# Patient Record
Sex: Female | Born: 1950
Health system: Southern US, Community
[De-identification: ages and names within clinical notes are randomized; demographics above are authoritative.]

## PROBLEM LIST (undated history)

## (undated) DIAGNOSIS — M7121 Synovial cyst of popliteal space [Baker], right knee: Secondary | ICD-10-CM

## (undated) DIAGNOSIS — M8580 Other specified disorders of bone density and structure, unspecified site: Secondary | ICD-10-CM

## (undated) DIAGNOSIS — K5790 Diverticulosis of intestine, part unspecified, without perforation or abscess without bleeding: Secondary | ICD-10-CM

## (undated) DIAGNOSIS — I10 Essential (primary) hypertension: Secondary | ICD-10-CM

## (undated) DIAGNOSIS — E785 Hyperlipidemia, unspecified: Secondary | ICD-10-CM

## (undated) HISTORY — DX: Synovial cyst of popliteal space (Baker), right knee: M71.21

## (undated) HISTORY — DX: Essential (primary) hypertension: I10

## (undated) HISTORY — DX: Other specified disorders of bone density and structure, unspecified site: M85.80

## (undated) HISTORY — DX: Diverticulosis of intestine, part unspecified, without perforation or abscess without bleeding: K57.90

## (undated) HISTORY — DX: Hyperlipidemia, unspecified: E78.5

---

## 2010-11-16 HISTORY — PX: MENISCUS REPAIR: SHX5179

## 2013-11-16 DIAGNOSIS — M7121 Synovial cyst of popliteal space [Baker], right knee: Secondary | ICD-10-CM

## 2013-11-16 HISTORY — DX: Synovial cyst of popliteal space (Baker), right knee: M71.21

## 2014-01-12 LAB — HM COLONOSCOPY

## 2015-11-17 HISTORY — PX: CATARACT EXTRACTION: SUR2

## 2017-04-01 LAB — HM MAMMOGRAPHY

## 2017-04-16 LAB — HM DEXA SCAN

## 2017-11-16 DIAGNOSIS — R69 Illness, unspecified: Secondary | ICD-10-CM | POA: Diagnosis not present

## 2018-06-28 ENCOUNTER — Ambulatory Visit: Payer: Self-pay | Admitting: Nurse Practitioner

## 2018-07-27 ENCOUNTER — Ambulatory Visit (INDEPENDENT_AMBULATORY_CARE_PROVIDER_SITE_OTHER): Payer: Medicare HMO | Admitting: Nurse Practitioner

## 2018-07-27 ENCOUNTER — Other Ambulatory Visit: Payer: Self-pay

## 2018-07-27 ENCOUNTER — Encounter: Payer: Self-pay | Admitting: Nurse Practitioner

## 2018-07-27 VITALS — BP 126/65 | HR 52 | Temp 97.8°F | Ht 63.5 in | Wt 167.0 lb

## 2018-07-27 DIAGNOSIS — Z7689 Persons encountering health services in other specified circumstances: Secondary | ICD-10-CM

## 2018-07-27 DIAGNOSIS — E782 Mixed hyperlipidemia: Secondary | ICD-10-CM | POA: Diagnosis not present

## 2018-07-27 DIAGNOSIS — I10 Essential (primary) hypertension: Secondary | ICD-10-CM

## 2018-07-27 DIAGNOSIS — M7121 Synovial cyst of popliteal space [Baker], right knee: Secondary | ICD-10-CM

## 2018-07-27 MED ORDER — TRIAMTERENE-HCTZ 75-50 MG PO TABS: ORAL_TABLET | ORAL | 1 refills | Status: DC

## 2018-07-27 MED ORDER — SIMVASTATIN 10 MG PO TABS: 10.0000 mg | ORAL_TABLET | Freq: Every day | ORAL | 1 refills | Status: DC

## 2018-07-27 MED ORDER — LOSARTAN POTASSIUM 100 MG PO TABS: 100.0000 mg | ORAL_TABLET | Freq: Every day | ORAL | 1 refills | Status: DC

## 2018-07-27 MED ORDER — VERAPAMIL HCL 120 MG PO TABS: 120.0000 mg | ORAL_TABLET | Freq: Two times a day (BID) | ORAL | 1 refills | Status: DC

## 2018-07-27 NOTE — Assessment & Plan Note (Signed)
Controlled hypertension.  BP goal < 130/80.  Pt is working on lifestyle modifications.  Taking medications tolerating well without side effects. No currently known complications.  Plan: 1. Continue taking all medications without changes: - Losartan 100 mg once daily - Triamterene-HCTZ 75-50 mg once daily - verapamil 120 mg once daily 2. Obtain labs in next 2 weeks  3. Encouraged heart healthy diet and increasing exercise to 30 minutes most days of the week. 4. Check BP 1-2 x per week at home, keep log, and bring to clinic at next appointment. 5. Follow up 6 months.

## 2018-07-27 NOTE — Assessment & Plan Note (Signed)
Previously stable on labs.  No recent recheck.  Prior statin intolerance, but patient is taking simvastatin and tolerates well at current dose.    Plan: 1. Continue simvastatin 10 mg once daily 2. Continue work toward staying active and eating a generally healthy diet. 3. Labs in next 2 weeks - fasting 4. Followup after labs and in 6 months.

## 2018-07-27 NOTE — Patient Instructions (Addendum)
Pricilla Loveless,   Thank you for coming in to clinic today.  1. You will be due for FASTING BLOOD WORK.  This means you should eat no food or drink after midnight.  Drink only water or coffee without cream/sugar on the morning of your lab visit. - Please go ahead and have your labs drawn at [facility] for lab draw in about 2 weeks. - Your results will be available about 2-3 days after blood draw.  If you have set up a MyChart account, you can can log in to MyChart online to view your results and a brief explanation. Also, we can discuss your results together at your next office visit if you would like.  2. Continue medications without changes today.  3. Referral to Orthopedic Surgery for your baker's cyst.  Let me know if you prefer a different person in network.  Please schedule a follow-up appointment with [name], AGNP. Return in about 6 weeks (around 09/07/2018) for annual physical AND 6 months for Hypertension and Cholesterol.  If you have any other questions or concerns, please feel free to call the clinic or send a message through [place]. You may also schedule an earlier appointment if necessary.  You will receive a survey after today's visit either digitally by e-mail or paper by [employer]. Your experiences and feedback matter to [place].  Please respond so we know how we are doing as we provide care for you.   [name], DNP, AGNP-BC Adult Gerontology Nurse Practitioner [place]

## 2018-07-27 NOTE — Assessment & Plan Note (Signed)
Chronic presence of baker's cyst with subacute worsening/movement lower on popliteal space of right knee.  Now interferes with walking/ flexion of knee by causing a mild, nagging, aching pain.  Plan: 1. Patient desires orthopedic referral.  Referral placed. 2. Encouraged patient to take ibuprofen or Aleeve prn for discomfort and to help reduce swelling. 3. Considered imaging, but will defer as best option may be MRI. 4. Followup prn

## 2018-07-27 NOTE — Progress Notes (Signed)
Subjective:    Patient ID: Robin Lang, female    DOB: 1951-09-25, 67 y.o.   MRN: [id number]  Robin Lang is a 67 y.o. female presenting on 07/27/2018 for Establish Care (continuing medications ) and Cyst (baker cyst in the back of the Right knee x 4 yrs)  Patient is accompanied today by her sister, Jeronimo Greaves.  HPI Establish Care New Provider Pt last seen by PCP Lorrine Kin, MD at [facility], [place] about 6 months.  Obtain records.   - Obtains yearly DEXA scans for osteopenia.  Hypertension - Current medications: losartan, triamterene-hctz, verapamil, tolerating well without side effects - She is not currently symptomatic. - Pt denies headache, lightheadedness, dizziness, changes in vision, chest tightness/pressure, palpitations, leg swelling, sudden loss of speech or loss of consciousness. - She  reports an exercise routine that includes walking, 4-5 days per week. - Her diet is moderate in salt, moderate in fat, and moderate in carbohydrates.   Hyperlipidemia Patient is currently taking simvastatin 10 mg once daily and is tolerating it well without side effects.   Pt denies changes in vision, chest tightness/pressure, palpitations, shortness of breath, leg pain while walking, leg or arm weakness, and sudden loss of speech or loss of consciousness.   Baker's Cyst Has history of baker's cyst on Right knee.  Has had cortisone injections in past with frequent walking and Has lowered some to lower leg and bothers her with bending leg now and has aching, nagging pain.  Has had meniscus repairs in both knees on Left knee last in 2012.    Past Medical History:  Diagnosis Date  . Diverticulosis   . Hyperlipidemia   . Hypertension   . Osteopenia   . Osteoporosis    Past Surgical History:  Procedure Laterality Date  . CATARACT EXTRACTION Right 2017  . MENISCUS REPAIR Left 2012   Social History   Socioeconomic History  . Marital status: Divorced    Spouse  name: Not on file  . Number of children: 2  . Years of education: Not on file  . Highest education level: Some college, no degree  Occupational History  . Not on file  Social Needs  . Financial resource strain: Not on file  . Food insecurity:    Worry: Not on file    Inability: Not on file  . Transportation needs:    Medical: Not on file    Non-medical: Not on file  Tobacco Use  . Smoking status: Former Smoker    Packs/day: 0.50    Years: 9.00    Pack years: 4.50    Last attempt to quit: 1989    Years since quitting: 30.7  . Smokeless tobacco: Never Used  Substance and Sexual Activity  . Alcohol use: Yes    Alcohol/week: 2.0 standard drinks    Types: 2 Glasses of wine per week  . Drug use: Never  . Sexual activity: Not Currently  Lifestyle  . Physical activity:    Days per week: Not on file    Minutes per session: Not on file  . Stress: Not on file  Relationships  . Social connections:    Talks on phone: Not on file    Gets together: Not on file    Attends religious service: Not on file    Active member of club or organization: Not on file    Attends meetings of clubs or organizations: Not on file    Relationship status: Not on file  .  Intimate partner violence:    Fear of current or ex partner: Not on file    Emotionally abused: Not on file    Physically abused: Not on file    Forced sexual activity: Not on file  Other Topics Concern  . Not on file  Social History Narrative  . Not on file   Family History  Problem Relation Age of Onset  . Stroke Mother   . Renal cancer Mother   . Heart disease Father   . Heart attack Father   . Lung cancer Sister        metastatic  . Thyroid cancer Brother        metastatic  . Heart disease Sister   . Multiple sclerosis Brother    Current Outpatient Medications on File Prior to Visit  Medication Sig  . co-enzyme Q-10 30 MG capsule Take 30 mg by mouth 3 (three) times daily.  . Multiple Vitamins-Minerals (CENTRUM  WOMEN) TABS Take by mouth.   No current facility-administered medications on file prior to visit.    Review of Systems  Constitutional: Negative for chills and fever.  HENT: Negative for congestion and sore throat.   Eyes: Negative for pain.  Respiratory: Negative for cough, shortness of breath and wheezing.   Cardiovascular: Negative for chest pain, palpitations and leg swelling.  Gastrointestinal: Negative for abdominal pain, blood in stool, constipation, diarrhea, nausea and vomiting.  Endocrine: Negative for polydipsia.  Genitourinary: Negative for dysuria, frequency, hematuria and urgency.  Musculoskeletal: Positive for arthralgias (knees). Negative for back pain, myalgias and neck pain.  Skin: Negative.  Negative for rash.  Allergic/Immunologic: Negative for environmental allergies.  Neurological: Negative for dizziness, weakness and headaches.  Hematological: Does not bruise/bleed easily.  Psychiatric/Behavioral: Negative for dysphoric mood and suicidal ideas. The patient is not nervous/anxious.    Per HPI unless specifically indicated above    Objective:    BP 126/65 (BP Location: Right Arm, Patient Position: Sitting, Cuff Size: Normal)   Pulse (!) 52   Temp 97.8 F (36.6 C) (Oral)   Ht 5' 3.5" (1.613 m)   Wt 167 lb (75.8 kg)   BMI 29.12 kg/m   Wt Readings from Last 3 Encounters:  07/27/18 167 lb (75.8 kg)    Physical Exam  Constitutional: She is oriented to person, place, and time. She appears well-developed and well-nourished. No distress.  HENT:  Head: Normocephalic and atraumatic.  Neck: Normal range of motion. Neck supple. Carotid bruit is not present.  Cardiovascular: Normal rate, regular rhythm, S1 normal, S2 normal, normal heart sounds and intact distal pulses.  Pulmonary/Chest: Effort normal and breath sounds normal. No respiratory distress.  Abdominal: Soft. Bowel sounds are normal. She exhibits no distension. There is no hepatosplenomegaly. There is no  tenderness. No hernia.  Musculoskeletal: She exhibits no edema (pedal).  Neurological: She is alert and oriented to person, place, and time.  Skin: Skin is warm and dry. Capillary refill takes less than 2 seconds.  Psychiatric: She has a normal mood and affect. Her behavior is normal. Judgment and thought content normal.  Vitals reviewed.     Assessment & Plan:   Problem List Items Addressed This Visit      Cardiovascular and Mediastinum   Essential hypertension - Primary    Controlled hypertension.  BP goal < 130/80.  Pt is working on lifestyle modifications.  Taking medications tolerating well without side effects. No currently known complications.  Plan: 1. Continue taking all medications without changes: - Losartan  100 mg once daily - Triamterene-HCTZ 75-50 mg once daily - verapamil 120 mg once daily 2. Obtain labs in next 2 weeks  3. Encouraged heart healthy diet and increasing exercise to 30 minutes most days of the week. 4. Check BP 1-2 x per week at home, keep log, and bring to clinic at next appointment. 5. Follow up 6 months.        Relevant Medications   losartan (COZAAR) 100 MG tablet   simvastatin (ZOCOR) 10 MG tablet   triamterene-hydrochlorothiazide (MAXZIDE) 75-50 MG tablet   verapamil (CALAN) 120 MG tablet   Other Relevant Orders   Lipid panel   Comprehensive metabolic panel     Musculoskeletal and Integument   Synovial cyst of right popliteal space    Chronic presence of baker's cyst with subacute worsening/movement lower on popliteal space of right knee.  Now interferes with walking/ flexion of knee by causing a mild, nagging, aching pain.  Plan: 1. Patient desires orthopedic referral.  Referral placed. 2. Encouraged patient to take ibuprofen or Aleeve prn for discomfort and to help reduce swelling. 3. Considered imaging, but will defer as best option may be MRI. 4. Followup prn      Relevant Orders   Ambulatory referral to Orthopedic Surgery      Other   Mixed hyperlipidemia    Previously stable on labs.  No recent recheck.  Prior statin intolerance, but patient is taking simvastatin and tolerates well at current dose.    Plan: 1. Continue simvastatin 10 mg once daily 2. Continue work toward staying active and eating a generally healthy diet. 3. Labs in next 2 weeks - fasting 4. Followup after labs and in 6 months.      Relevant Medications   losartan (COZAAR) 100 MG tablet   simvastatin (ZOCOR) 10 MG tablet   triamterene-hydrochlorothiazide (MAXZIDE) 75-50 MG tablet   verapamil (CALAN) 120 MG tablet   Other Relevant Orders   Lipid panel   Comprehensive metabolic panel    Other Visit Diagnoses    Encounter to establish care        Previous PCP was in [place].  Records will be requested.  Past medical, family, and surgical history reviewed w/ patient in clinic today.     Meds ordered this encounter  Medications  . losartan (COZAAR) 100 MG tablet    Sig: Take 1 tablet (100 mg total) by mouth daily.    Dispense:  90 tablet    Refill:  1    Order Specific Question:   Supervising Provider    Answer:   Olin Hauser [[id number]]  . simvastatin (ZOCOR) 10 MG tablet    Sig: Take 1 tablet (10 mg total) by mouth daily.    Dispense:  90 tablet    Refill:  1    Order Specific Question:   Supervising Provider    Answer:   Olin Hauser [[id number]]  . triamterene-hydrochlorothiazide (MAXZIDE) 75-50 MG tablet    Sig: TAKE 1/2 (ONE HALF) TABLET BY MOUTH ONCE DAILY    Dispense:  45 tablet    Refill:  1    Order Specific Question:   Supervising Provider    Answer:   Olin Hauser [[id number]]  . verapamil (CALAN) 120 MG tablet    Sig: Take 1 tablet (120 mg total) by mouth 2 (two) times daily.    Dispense:  90 tablet    Refill:  1    Order Specific Question:   Supervising Provider  Answer:   Olin Hauser [[id number]]     Follow up plan: Return in about 6 weeks (around 09/07/2018) for annual  physical AND 6 months for Hypertension and Cholesterol.  [name], DNP, AGPCNP-BC Adult Gerontology Primary Care Nurse Practitioner [facility] 07/27/2018, 10:40 AM

## 2018-08-07 DIAGNOSIS — R69 Illness, unspecified: Secondary | ICD-10-CM | POA: Diagnosis not present

## 2018-08-08 ENCOUNTER — Encounter: Payer: Self-pay | Admitting: Nurse Practitioner

## 2018-08-09 DIAGNOSIS — M1711 Unilateral primary osteoarthritis, right knee: Secondary | ICD-10-CM | POA: Diagnosis not present

## 2018-08-16 DIAGNOSIS — E782 Mixed hyperlipidemia: Secondary | ICD-10-CM | POA: Diagnosis not present

## 2018-08-16 DIAGNOSIS — I10 Essential (primary) hypertension: Secondary | ICD-10-CM | POA: Diagnosis not present

## 2018-08-17 LAB — COMPREHENSIVE METABOLIC PANEL
ALT: 22 IU/L (ref 0–32)
AST: 21 IU/L (ref 0–40)
Albumin/Globulin Ratio: 1.9 (ref 1.2–2.2)
Albumin: 4.2 g/dL (ref 3.6–4.8)
Alkaline Phosphatase: 53 IU/L (ref 39–117)
BUN/Creatinine Ratio: 24 (ref 12–28)
BUN: 24 mg/dL (ref 8–27)
Bilirubin Total: 0.4 mg/dL (ref 0.0–1.2)
CO2: 22 mmol/L (ref 20–29)
Calcium: 10.1 mg/dL (ref 8.7–10.3)
Chloride: 104 mmol/L (ref 96–106)
Creatinine, Ser: 0.98 mg/dL (ref 0.57–1.00)
GFR calc Af Amer: 69 mL/min/{1.73_m2} (ref >59)
GFR calc non Af Amer: 59 mL/min/{1.73_m2} — ABNORMAL LOW (ref >59)
Globulin, Total: 2.2 g/dL (ref 1.5–4.5)
Glucose: 96 mg/dL (ref 65–99)
Potassium: 4 mmol/L (ref 3.5–5.2)
Sodium: 141 mmol/L (ref 134–144)
Total Protein: 6.4 g/dL (ref 6.0–8.5)

## 2018-08-17 LAB — LIPID PANEL
Chol/HDL Ratio: 2.8 ratio (ref 0.0–4.4)
Cholesterol, Total: 199 mg/dL (ref 100–199)
HDL: 72 mg/dL (ref >39)
LDL Calculated: 108 mg/dL — ABNORMAL HIGH (ref 0–99)
Triglycerides: 93 mg/dL (ref 0–149)
VLDL Cholesterol Cal: 19 mg/dL (ref 5–40)

## 2018-08-23 ENCOUNTER — Telehealth: Payer: Self-pay | Admitting: Nurse Practitioner

## 2018-08-23 ENCOUNTER — Telehealth: Payer: Self-pay

## 2018-08-23 DIAGNOSIS — Z1239 Encounter for other screening for malignant neoplasm of breast: Secondary | ICD-10-CM

## 2018-08-23 DIAGNOSIS — Z1211 Encounter for screening for malignant neoplasm of colon: Secondary | ICD-10-CM

## 2018-08-23 NOTE — Telephone Encounter (Signed)
Attempted to contact the pt, no answer. LMOM to return my call.  

## 2018-08-23 NOTE — Telephone Encounter (Signed)
Patient returned your call Sharyn Lull) Please call

## 2018-08-23 NOTE — Telephone Encounter (Signed)
Patient sent appointment requests asking how to schedule Colonoscopy and mammogram on 08/12/2018.  Message received by paper to inbox on 08/22/2018.  Patient can have these addressed at a medicare wellness visit, which is recommended at patient's next convenience.    Also placed orders today for: 1. colonoscopy referral - they will call to schedule.  2. Screening mammogram.  Patient needs to call and schedule.  Call the Scheduling phone number at [phone].  Keep appointment with me on 10/24 and we can followup at that time as well.  Kelita, Please call patient to let her know this is in process.

## 2018-08-23 NOTE — Telephone Encounter (Signed)
Returned patients call regarding colonoscopy referral.  She was unaware of the referral being sent.  She will look at the [facility] website to decide which physician she would like to have do her colonoscopy and call back at another time to schedule.  Thanks [employer]

## 2018-08-24 DIAGNOSIS — M1711 Unilateral primary osteoarthritis, right knee: Secondary | ICD-10-CM | POA: Diagnosis not present

## 2018-08-26 NOTE — Telephone Encounter (Signed)
Mychart message sent to the pt. I attempted to contact her, answer. LMOM to return my call.

## 2018-09-02 DIAGNOSIS — R69 Illness, unspecified: Secondary | ICD-10-CM | POA: Diagnosis not present

## 2018-09-05 ENCOUNTER — Encounter: Payer: Self-pay | Admitting: Nurse Practitioner

## 2018-09-07 DIAGNOSIS — R69 Illness, unspecified: Secondary | ICD-10-CM | POA: Diagnosis not present

## 2018-09-08 ENCOUNTER — Other Ambulatory Visit: Payer: Self-pay

## 2018-09-08 ENCOUNTER — Encounter: Payer: Self-pay | Admitting: Nurse Practitioner

## 2018-09-08 ENCOUNTER — Ambulatory Visit (INDEPENDENT_AMBULATORY_CARE_PROVIDER_SITE_OTHER): Payer: Medicare HMO | Admitting: Nurse Practitioner

## 2018-09-08 VITALS — BP 145/69 | HR 63 | Temp 97.6°F | Ht 63.5 in | Wt 172.0 lb

## 2018-09-08 DIAGNOSIS — Z114 Encounter for screening for human immunodeficiency virus [HIV]: Secondary | ICD-10-CM | POA: Diagnosis not present

## 2018-09-08 DIAGNOSIS — Z1159 Encounter for screening for other viral diseases: Secondary | ICD-10-CM

## 2018-09-08 DIAGNOSIS — R69 Illness, unspecified: Secondary | ICD-10-CM | POA: Diagnosis not present

## 2018-09-08 DIAGNOSIS — N183 Chronic kidney disease, stage 3 unspecified: Secondary | ICD-10-CM

## 2018-09-08 DIAGNOSIS — Z0000 Encounter for general adult medical examination without abnormal findings: Secondary | ICD-10-CM | POA: Diagnosis not present

## 2018-09-08 NOTE — Patient Instructions (Addendum)
Pricilla Loveless,   Thank you for coming in to clinic today.  1. Your provider would like to you have your annual eye exam. Please contact your current eye doctor or here are some good options for you to contact.   [facility]   Address: [address] [place], [place] [address] Phone: [phone]  Website: [email or web address] [address] [place], [place] [address] Phone: [phone] [email or web address]  [facility]  Address: [place], [place], [place] [address] Phone: [phone]   [facility] [address] [place], [place] [address] Phone: [phone]  [facility] Address: [place], [place], [place] [address]  Phone: [phone]  2. Shingrix - Shingles vaccine: 2 doses 2-6 months apart.  http://www.wolf.info/ for more information if you have questions. - You will need to get this at your local pharmacy.  3.  Tdap - tetanus plus Whooping cough booster - You will also need to get this at your local pharmacy  4. DEXA scan, colonoscopy: - Bring or copy your documentation and send to the clinic.  5. Your mammogram order has been placed.  Call the Scheduling phone number at [phone] to schedule your mammogram at your convenience.  You can choose to go to either location listed below.  Let the scheduler know which location you prefer.  [place]  [place], [place] [address]   [facility] [address], [place] [address]  ** Repeat Kidney function on meloxicam in about 3 months.  Take a break from meloxicam at least 1 day per week to allow kidney recovery.  You have Chronic Kidney Disease stage 3 - very early reduced kidney function.  Protect your kidneys by staying hydrated, keeping BP controlled, and avoiding medications that may damage your kidneys.  Call clinic the day you are going to have  your kidney testing done and we will fax orders to [facility].  Please schedule a follow-up appointment with [name], AGNP. Return in about 1 year (around 09/09/2019) for annual physical.  If you have any other questions or concerns, please feel free to call the clinic or send a message through [place]. You may also schedule an earlier appointment if necessary.  You will receive a survey after today's visit either digitally by e-mail or paper by [employer]. Your experiences and feedback matter to [place].  Please respond so we know how we are doing as we provide care for you.   [name], DNP, AGNP-BC Adult Gerontology Nurse Practitioner [place]

## 2018-09-08 NOTE — Progress Notes (Signed)
Subjective:    Patient ID: Robin Lang, female    DOB: 07/29/1951, 67 y.o.   MRN: [id number]  Robin Lang is a 67 y.o. female presenting on 09/08/2018 for Annual Exam   HPI Annual Physical Exam Patient has been feeling well.  They have no acute concerns today. Sleeps 5-7 hours per night uninterrupted usually.  Occasionally wakes to void.  HEALTH MAINTENANCE: Weight/BMI: increased slightly Physical activity: Regular - has just started [employer] with silver sneakers Diet: mostly vegetables plus protein shakes (with almond milk and banana), eggs Seatbelt: always Sunscreen: not regularly PAP: Last was normal last year Mammogram: due  DEXA: Last was about 1 year ago with osteopenia - repeat in 1 year Colon Cancer Screen: Had one 5 years ago - was due for repeat in 10 years HIV/HEP C: not currently sexually active.  Neg in 2001.   Optometry: regular visits Dentistry: regular - upcoming dental surgery  VACCINES: Tetanus: due Influenza: received 08/07/2018 Pneumonia: up to date Shingles: Due - has history of shingles  Past Medical History:  Diagnosis Date  . Baker's cyst of knee, right 2015  . Diverticulosis   . Hyperlipidemia   . Hypertension   . Osteopenia    Past Surgical History:  Procedure Laterality Date  . CATARACT EXTRACTION Right 2017  . MENISCUS REPAIR Left 2012   Social History   Socioeconomic History  . Marital status: Divorced    Spouse name: Not on file  . Number of children: 2  . Years of education: Not on file  . Highest education level: Some college, no degree  Occupational History  . Not on file  Social Needs  . Financial resource strain: Not on file  . Food insecurity:    Worry: Not on file    Inability: Not on file  . Transportation needs:    Medical: Not on file    Non-medical: Not on file  Tobacco Use  . Smoking status: Former Smoker    Packs/day: 0.50    Years: 9.00    Pack years: 4.50    Last attempt to quit: 1989    Years  since quitting: 30.8  . Smokeless tobacco: Never Used  Substance and Sexual Activity  . Alcohol use: Yes    Alcohol/week: 2.0 standard drinks    Types: 2 Glasses of wine per week  . Drug use: Never  . Sexual activity: Not Currently  Lifestyle  . Physical activity:    Days per week: Not on file    Minutes per session: Not on file  . Stress: Not on file  Relationships  . Social connections:    Talks on phone: Not on file    Gets together: Not on file    Attends religious service: Not on file    Active member of club or organization: Not on file    Attends meetings of clubs or organizations: Not on file    Relationship status: Not on file  . Intimate partner violence:    Fear of current or ex partner: Not on file    Emotionally abused: Not on file    Physically abused: Not on file    Forced sexual activity: Not on file  Other Topics Concern  . Not on file  Social History Narrative  . Not on file   Family History  Problem Relation Age of Onset  . Stroke Mother   . Renal cancer Mother   . Heart disease Father   . Heart attack Father   .  Lung cancer Sister        metastatic  . Thyroid cancer Brother        metastatic  . Heart disease Sister   . Multiple sclerosis Brother    Current Outpatient Medications on File Prior to Visit  Medication Sig  . co-enzyme Q-10 30 MG capsule Take 30 mg by mouth 3 (three) times daily.  Marland Kitchen losartan (COZAAR) 100 MG tablet Take 1 tablet (100 mg total) by mouth daily.  . meloxicam (MOBIC) 15 MG tablet Take 15 mg by mouth daily.  . Multiple Vitamins-Minerals (CENTRUM WOMEN) TABS Take by mouth.  . simvastatin (ZOCOR) 10 MG tablet Take 1 tablet (10 mg total) by mouth daily.  Marland Kitchen triamterene-hydrochlorothiazide (MAXZIDE) 75-50 MG tablet TAKE 1/2 (ONE HALF) TABLET BY MOUTH ONCE DAILY  . verapamil (CALAN) 120 MG tablet Take 1 tablet (120 mg total) by mouth 2 (two) times daily.   No current facility-administered medications on file prior to visit.      Review of Systems  Constitutional: Negative for chills and fever.  HENT: Negative for congestion and sore throat.   Eyes: Negative for pain.  Respiratory: Negative for cough, shortness of breath and wheezing.   Cardiovascular: Negative for chest pain, palpitations and leg swelling.  Gastrointestinal: Negative for abdominal pain, blood in stool, constipation, diarrhea, nausea and vomiting.  Endocrine: Negative for polydipsia.  Genitourinary: Negative for dysuria, frequency, hematuria and urgency.  Musculoskeletal: Positive for arthralgias. Negative for back pain, myalgias and neck pain.  Skin: Negative.  Negative for rash.  Allergic/Immunologic: Negative for environmental allergies.  Neurological: Negative for dizziness, weakness and headaches.  Hematological: Does not bruise/bleed easily.  Psychiatric/Behavioral: Negative for dysphoric mood and suicidal ideas. The patient is not nervous/anxious.    Per HPI unless specifically indicated above     Objective:    BP (!) 145/69   Pulse 63   Temp 97.6 F (36.4 C) (Oral)   Ht 5' 3.5" (1.613 m)   Wt 172 lb (78 kg)   BMI 29.99 kg/m   Wt Readings from Last 3 Encounters:  09/08/18 172 lb (78 kg)  07/27/18 167 lb (75.8 kg)    Physical Exam  Constitutional: She is oriented to person, place, and time. She appears well-developed and well-nourished. No distress.  HENT:  Head: Normocephalic and atraumatic.  Right Ear: External ear normal.  Left Ear: External ear normal.  Nose: Nose normal.  Mouth/Throat: Oropharynx is clear and moist.  Eyes: Pupils are equal, round, and reactive to light. Conjunctivae are normal.  Neck: Normal range of motion. Neck supple. No JVD present. No tracheal deviation present. No thyromegaly present.  Cardiovascular: Normal rate, regular rhythm, normal heart sounds and intact distal pulses. Exam reveals no gallop and no friction rub.  No murmur heard. Pulmonary/Chest: Effort normal and breath sounds normal.  No respiratory distress.  Abdominal: Soft. Bowel sounds are normal. She exhibits no distension. There is no hepatosplenomegaly. There is no tenderness.  Genitourinary: Rectal exam shows external hemorrhoid. Rectal exam shows no internal hemorrhoid, no fissure, no tenderness and anal tone normal.  Genitourinary Comments: Normal external female genitalia without lesions or fusion. Physiologic discharge on exam. Bimanual exam without adnexal masses, enlarged uterus, or cervical motion tenderness.  Breast - Normal exam w/ symmetric breasts, no mass, no nipple discharge, no skin changes or tenderness.    Musculoskeletal: Normal range of motion.  Lymphadenopathy:    She has no cervical adenopathy.  Neurological: She is alert and oriented to person, place,  and time. No cranial nerve deficit.  Skin: Skin is warm and dry.  Psychiatric: She has a normal mood and affect. Her behavior is normal. Judgment and thought content normal.  Nursing note and vitals reviewed.   Results for orders placed or performed in visit on 07/27/18  Lipid panel  Result Value Ref Range   Cholesterol, Total 199 100 - 199 mg/dL   Triglycerides 93 0 - 149 mg/dL   HDL 72 >39 mg/dL   VLDL Cholesterol Cal 19 5 - 40 mg/dL   LDL Calculated 108 (H) 0 - 99 mg/dL   Chol/HDL Ratio 2.8 0.0 - 4.4 ratio  Comprehensive metabolic panel  Result Value Ref Range   Glucose 96 65 - 99 mg/dL   BUN 24 8 - 27 mg/dL   Creatinine, Ser 0.98 0.57 - 1.00 mg/dL   GFR calc non Af Amer 59 (L) >59 mL/min/1.73   GFR calc Af Amer 69 >59 mL/min/1.73   BUN/Creatinine Ratio 24 12 - 28   Sodium 141 134 - 144 mmol/L   Potassium 4.0 3.5 - 5.2 mmol/L   Chloride 104 96 - 106 mmol/L   CO2 22 20 - 29 mmol/L   Calcium 10.1 8.7 - 10.3 mg/dL   Total Protein 6.4 6.0 - 8.5 g/dL   Albumin 4.2 3.6 - 4.8 g/dL   Globulin, Total 2.2 1.5 - 4.5 g/dL   Albumin/Globulin Ratio 1.9 1.2 - 2.2   Bilirubin Total 0.4 0.0 - 1.2 mg/dL   Alkaline Phosphatase 53 39 - 117 IU/L    AST 21 0 - 40 IU/L   ALT 22 0 - 32 IU/L      Assessment & Plan:   Problem List Items Addressed This Visit    None    Visit Diagnoses    Encounter for annual physical exam    -  Primary   Encounter for hepatitis C screening test for low risk patient       Relevant Orders   Hepatitis C antibody   Screening for HIV without presence of risk factors       Relevant Orders   HIV Antibody (routine testing w rflx)   CKD (chronic kidney disease) stage 3, GFR 30-59 ml/min (HCC)       Relevant Orders   Comprehensive metabolic panel      Physical exam with no new findings.  Well adult with no acute concerns.  Pre-visit labs reviewed and indicates patient has early stage III CKD. BP slightly elevated today.  Plan: 1. Obtain health maintenance screenings as above according to age. - Increase physical activity to 30 minutes most days of the week.  - Eat healthy diet high in vegetables and fruits; low in refined carbohydrates. - Due for HIV/HEPC screening in person with low risk behavior - Can defer DEXA to next year with prior osteopenia 1 year ago - mammogram this year - Bring documentation of prior DEXA, mammo, Coronary artery calcium scoring, colonoscopy. 2. For CKD: repeat CMP after meloxicam use in 3 months.  Can consider repeating sooner.  Reviewed need to take at least 1 day off per week for kidneys.  Use prn if possible.  Also reviewed other ways to protect and prevent further kidney damage. Return 1 year for annual physical.   Follow up plan: Return in about 1 year (around 09/09/2019) for annual physical.  [name], DNP, AGPCNP-BC Adult Gerontology Primary Care Nurse Practitioner [facility] 09/08/2018, 3:32 PM

## 2018-09-09 DIAGNOSIS — R69 Illness, unspecified: Secondary | ICD-10-CM | POA: Diagnosis not present

## 2018-09-12 ENCOUNTER — Ambulatory Visit
Admission: RE | Admit: 2018-09-12 | Discharge: 2018-09-12 | Disposition: A | Discharge status: Home / Self Care | Payer: Medicare HMO | Source: Ambulatory Visit | Attending: Nurse Practitioner | Admitting: Nurse Practitioner

## 2018-09-12 DIAGNOSIS — Z1231 Encounter for screening mammogram for malignant neoplasm of breast: Secondary | ICD-10-CM | POA: Insufficient documentation

## 2018-09-12 DIAGNOSIS — Z1239 Encounter for other screening for malignant neoplasm of breast: Secondary | ICD-10-CM

## 2018-09-13 DIAGNOSIS — M1711 Unilateral primary osteoarthritis, right knee: Secondary | ICD-10-CM | POA: Diagnosis not present

## 2018-09-14 DIAGNOSIS — R69 Illness, unspecified: Secondary | ICD-10-CM | POA: Diagnosis not present

## 2018-09-21 ENCOUNTER — Encounter: Payer: Self-pay | Admitting: Nurse Practitioner

## 2018-09-27 DIAGNOSIS — D2271 Melanocytic nevi of right lower limb, including hip: Secondary | ICD-10-CM | POA: Diagnosis not present

## 2018-09-27 DIAGNOSIS — L400 Psoriasis vulgaris: Secondary | ICD-10-CM | POA: Diagnosis not present

## 2018-09-27 DIAGNOSIS — D2261 Melanocytic nevi of right upper limb, including shoulder: Secondary | ICD-10-CM | POA: Diagnosis not present

## 2018-09-27 DIAGNOSIS — D225 Melanocytic nevi of trunk: Secondary | ICD-10-CM | POA: Diagnosis not present

## 2018-09-27 DIAGNOSIS — D485 Neoplasm of uncertain behavior of skin: Secondary | ICD-10-CM | POA: Diagnosis not present

## 2018-09-27 DIAGNOSIS — R69 Illness, unspecified: Secondary | ICD-10-CM | POA: Diagnosis not present

## 2018-09-27 DIAGNOSIS — D2272 Melanocytic nevi of left lower limb, including hip: Secondary | ICD-10-CM | POA: Diagnosis not present

## 2018-09-27 DIAGNOSIS — D2262 Melanocytic nevi of left upper limb, including shoulder: Secondary | ICD-10-CM | POA: Diagnosis not present

## 2018-09-27 DIAGNOSIS — D0462 Carcinoma in situ of skin of left upper limb, including shoulder: Secondary | ICD-10-CM | POA: Diagnosis not present

## 2018-10-07 DIAGNOSIS — C44629 Squamous cell carcinoma of skin of left upper limb, including shoulder: Secondary | ICD-10-CM | POA: Diagnosis not present

## 2018-10-27 DIAGNOSIS — M7121 Synovial cyst of popliteal space [Baker], right knee: Secondary | ICD-10-CM | POA: Diagnosis not present

## 2018-12-11 ENCOUNTER — Encounter: Payer: Self-pay | Admitting: Nurse Practitioner

## 2018-12-12 ENCOUNTER — Encounter: Payer: Self-pay | Admitting: Nurse Practitioner

## 2018-12-12 ENCOUNTER — Ambulatory Visit (INDEPENDENT_AMBULATORY_CARE_PROVIDER_SITE_OTHER): Payer: Medicare HMO | Admitting: Nurse Practitioner

## 2018-12-12 VITALS — BP 136/70 | HR 55 | Temp 98.0°F | Resp 16 | Ht 63.5 in | Wt 170.4 lb

## 2018-12-12 DIAGNOSIS — R21 Rash and other nonspecific skin eruption: Secondary | ICD-10-CM

## 2018-12-12 MED ORDER — CLOTRIMAZOLE-BETAMETHASONE 1-0.05 % EX CREA: 1.0000 "application " | TOPICAL_CREAM | Freq: Two times a day (BID) | CUTANEOUS | 0 refills | Status: DC

## 2018-12-12 MED ORDER — METHYLPREDNISOLONE ACETATE 40 MG/ML IJ SUSP
40.0000 mg | Freq: Once | INTRAMUSCULAR | Status: AC
  Administered 2018-12-12: 40 mg via INTRAMUSCULAR

## 2018-12-12 NOTE — Patient Instructions (Addendum)
Robin Lang,   Thank you for coming in to clinic today.  1. START lotrisone ointment twice daily for 14 days.  2. You have received a steroid injection today that will also help reduce your symptoms.  3. If not improving in next 2-3 days, call clinic for consideration of treatment change.  Please schedule a follow-up appointment with [name], AGNP. Return 2-5 days if symptoms worsen or fail to improve.  If you have any other questions or concerns, please feel free to call the clinic or send a message through [facility]. You may also schedule an earlier appointment if necessary.  You will receive a survey after today's visit either digitally by e-mail or paper by [employer]. Your experiences and feedback matter to [place].  Please respond so we know how we are doing as we provide care for you.  [name], DNP, AGNP-BC Adult Gerontology Nurse Practitioner [place]

## 2018-12-12 NOTE — Progress Notes (Signed)
Subjective:    Patient ID: Robin Lang, female    DOB: 05/22/1951, 67 y.o.   MRN: [id number]  Robin Lang is a 68 y.o. female presenting on 12/12/2018 for Rash (painful rash on inner thighs keeping her up at night. Also itching on hands. )   HPI Rash Patient presents with new rash between legs and on hands. STARTED Thurs last week.  Took new class that day with mat and steps.   Pain is described as burning.  Rash associated with itching.  Rash has continued to spread since starting. - Weights and balls that are not always wiped down. - Patient has intermittent sciatica, but this pain is very different. - Patient has not had a rash similar to this before. - Denies fever, chills, or sweats, drainage.  Social History   Tobacco Use  . Smoking status: Former Smoker    Packs/day: 0.50    Years: 9.00    Pack years: 4.50    Last attempt to quit: 1989    Years since quitting: 31.0  . Smokeless tobacco: Never Used  Substance Use Topics  . Alcohol use: Yes    Alcohol/week: 2.0 standard drinks    Types: 2 Glasses of wine per week  . Drug use: Never    Review of Systems Per HPI unless specifically indicated above     Objective:    BP 136/70   Pulse (!) 55   Temp 98 F (36.7 C) (Oral)   Resp 16   Ht 5' 3.5" (1.613 m)   Wt 170 lb 6.4 oz (77.3 kg)   SpO2 98%   BMI 29.71 kg/m   Wt Readings from Last 3 Encounters:  12/12/18 170 lb 6.4 oz (77.3 kg)  09/08/18 172 lb (78 kg)  07/27/18 167 lb (75.8 kg)    Physical Exam Vitals signs reviewed.  Constitutional:      General: She is not in acute distress.    Appearance: She is well-developed.  HENT:     Head: Normocephalic and atraumatic.  Cardiovascular:     Rate and Rhythm: Normal rate and regular rhythm.     Pulses:          Radial pulses are 2+ on the right side and 2+ on the left side.       Posterior tibial pulses are 1+ on the right side and 1+ on the left side.     Heart sounds: Normal heart sounds, S1 normal and  S2 normal.  Pulmonary:     Effort: Pulmonary effort is normal. No respiratory distress.     Breath sounds: Normal breath sounds and air entry.  Musculoskeletal:     Right lower leg: No edema.     Left lower leg: No edema.  Skin:    General: Skin is warm and dry.     Capillary Refill: Capillary refill takes less than 2 seconds.       Neurological:     Mental Status: She is alert and oriented to person, place, and time.  Psychiatric:        Attention and Perception: Attention normal.        Mood and Affect: Mood and affect normal.        Behavior: Behavior normal. Behavior is cooperative.    Results for orders placed or performed in visit on 09/15/18  HM MAMMOGRAPHY  Result Value Ref Range   HM Mammogram 0-4 Bi-Rad 0-4 Bi-Rad, Self Reported Normal  HM DEXA SCAN  Result Value Ref  Range   HM Dexa Scan osteopenia       Assessment & Plan:   Problem List Items Addressed This Visit    None    Visit Diagnoses    Rash in adult    -  Primary   Relevant Medications   clotrimazole-betamethasone (LOTRISONE) cream   methylPREDNISolone acetate (DEPO-MEDROL) injection 40 mg (Completed)    Rash appears to be inflammatory or fungal intertriginous rash.   May be complicated by exposure at gym and remaining in wet clothing. - No apparent bacterial infection at this time.  Rash currently uncomplicated.  Plan: 1. START lotrisone ointment bid x7-14 days 2. For itching/pain today, start with depomedrol injection.  No need for ongoing prednisone taper. 3. If rash continues spreading and worsening with treatment, cannot fully exclude cellulitis.  Instructed patient to call back to clinic. 4. FOLLOW-UP prn 5-7 days.  Meds ordered this encounter  Medications  . clotrimazole-betamethasone (LOTRISONE) cream    Sig: Apply 1 application topically 2 (two) times daily.    Dispense:  30 g    Refill:  0    Order Specific Question:   Supervising Provider    Answer:   Olin Hauser [[id number]]   . methylPREDNISolone acetate (DEPO-MEDROL) injection 40 mg    Follow up plan: Return 2-5 days if symptoms worsen or fail to improve.  [name], DNP, AGPCNP-BC Adult Gerontology Primary Care Nurse Practitioner [facility] 12/12/2018, 11:47 AM

## 2018-12-13 ENCOUNTER — Telehealth: Payer: Self-pay | Admitting: Nurse Practitioner

## 2018-12-13 DIAGNOSIS — L03818 Cellulitis of other sites: Secondary | ICD-10-CM

## 2018-12-13 MED ORDER — SULFAMETHOXAZOLE-TRIMETHOPRIM 800-160 MG PO TABS: 1.0000 | ORAL_TABLET | Freq: Two times a day (BID) | ORAL | 0 refills | Status: AC

## 2018-12-13 NOTE — Telephone Encounter (Signed)
STOP lotrisone  START Bactrim 800-160 mg one tablet twice daily for 10 days.  With continued spread, this is likely bacterial and needs the above antibiotic.

## 2018-12-13 NOTE — Telephone Encounter (Signed)
Advised 

## 2018-12-13 NOTE — Telephone Encounter (Signed)
Pt's rash is spreading.  Please call [phone]

## 2018-12-16 ENCOUNTER — Encounter: Payer: Self-pay | Admitting: Nurse Practitioner

## 2018-12-19 ENCOUNTER — Encounter: Payer: Self-pay | Admitting: Nurse Practitioner

## 2019-01-02 DIAGNOSIS — M25561 Pain in right knee: Secondary | ICD-10-CM | POA: Diagnosis not present

## 2019-01-02 DIAGNOSIS — M5136 Other intervertebral disc degeneration, lumbar region: Secondary | ICD-10-CM | POA: Diagnosis not present

## 2019-01-15 ENCOUNTER — Other Ambulatory Visit: Payer: Self-pay | Admitting: Nurse Practitioner

## 2019-01-15 DIAGNOSIS — I10 Essential (primary) hypertension: Secondary | ICD-10-CM

## 2019-02-15 ENCOUNTER — Other Ambulatory Visit: Payer: Self-pay | Admitting: Nurse Practitioner

## 2019-02-15 DIAGNOSIS — I10 Essential (primary) hypertension: Secondary | ICD-10-CM

## 2019-02-15 DIAGNOSIS — E782 Mixed hyperlipidemia: Secondary | ICD-10-CM

## 2019-03-08 ENCOUNTER — Other Ambulatory Visit: Payer: Self-pay | Admitting: Nurse Practitioner

## 2019-03-08 DIAGNOSIS — I10 Essential (primary) hypertension: Secondary | ICD-10-CM

## 2019-04-14 DIAGNOSIS — M25562 Pain in left knee: Secondary | ICD-10-CM | POA: Diagnosis not present

## 2019-04-14 DIAGNOSIS — M25561 Pain in right knee: Secondary | ICD-10-CM | POA: Diagnosis not present

## 2019-04-14 DIAGNOSIS — M5136 Other intervertebral disc degeneration, lumbar region: Secondary | ICD-10-CM | POA: Diagnosis not present

## 2019-04-23 ENCOUNTER — Other Ambulatory Visit: Payer: Self-pay | Admitting: Nurse Practitioner

## 2019-04-23 DIAGNOSIS — I10 Essential (primary) hypertension: Secondary | ICD-10-CM

## 2019-04-26 ENCOUNTER — Other Ambulatory Visit: Payer: Self-pay

## 2019-05-11 DIAGNOSIS — M170 Bilateral primary osteoarthritis of knee: Secondary | ICD-10-CM | POA: Diagnosis not present

## 2019-05-11 DIAGNOSIS — M67461 Ganglion, right knee: Secondary | ICD-10-CM | POA: Diagnosis not present

## 2019-06-06 ENCOUNTER — Other Ambulatory Visit: Payer: Self-pay | Admitting: Family Medicine

## 2019-06-06 DIAGNOSIS — I10 Essential (primary) hypertension: Secondary | ICD-10-CM

## 2019-06-21 ENCOUNTER — Ambulatory Visit (INDEPENDENT_AMBULATORY_CARE_PROVIDER_SITE_OTHER): Payer: Medicare HMO | Admitting: Nurse Practitioner

## 2019-06-21 ENCOUNTER — Ambulatory Visit
Admission: RE | Admit: 2019-06-21 | Discharge: 2019-06-21 | Disposition: A | Discharge status: Home / Self Care | Payer: Medicare HMO | Source: Ambulatory Visit | Attending: Nurse Practitioner | Admitting: Nurse Practitioner

## 2019-06-21 ENCOUNTER — Encounter: Payer: Self-pay | Admitting: Nurse Practitioner

## 2019-06-21 ENCOUNTER — Other Ambulatory Visit: Payer: Self-pay | Admitting: Nurse Practitioner

## 2019-06-21 ENCOUNTER — Other Ambulatory Visit: Payer: Self-pay

## 2019-06-21 VITALS — BP 136/80 | HR 76 | Ht 63.5 in | Wt 156.8 lb

## 2019-06-21 DIAGNOSIS — R2242 Localized swelling, mass and lump, left lower limb: Secondary | ICD-10-CM

## 2019-06-21 DIAGNOSIS — M7989 Other specified soft tissue disorders: Secondary | ICD-10-CM | POA: Diagnosis not present

## 2019-06-21 NOTE — Progress Notes (Signed)
Subjective:    Patient ID: Robin Lang, female    DOB: December 04, 1950, 68 y.o.   MRN: [id number]  Ericka Marcellus is a 68 y.o. female presenting on 06/21/2019 for Mass (painful 2cm lump on the left foot x 3 days. )   HPI Painful lump top of LEFT foot Patient noted new 2 cm round lump on top of foot for last 3 days.  This has occurred after progressively worsening left foot pain over last 3 weeks.  Patient is very active individual.  She walks every day, participates in exercise class regularly 4-5 days per week.  Now has pain with pressure, numb pain, constant.  Exercise class yesterday with lots of footwork / movement so is more sore today.  Worsens daily.  Lump is hard and has not become red/irritated, draining.  Social History   Tobacco Use  . Smoking status: Former Smoker    Packs/day: 0.50    Years: 9.00    Pack years: 4.50    Quit date: 1989    Years since quitting: 31.6  . Smokeless tobacco: Never Used  Substance Use Topics  . Alcohol use: Yes    Alcohol/week: 2.0 standard drinks    Types: 2 Glasses of wine per week  . Drug use: Never    Review of Systems Per HPI unless specifically indicated above     Objective:    BP 136/80 (BP Location: Left Arm, Patient Position: Sitting, Cuff Size: Normal)   Pulse 76   Ht 5' 3.5" (1.613 m)   Wt 156 lb 12.8 oz (71.1 kg)   BMI 27.34 kg/m   Wt Readings from Last 3 Encounters:  06/21/19 156 lb 12.8 oz (71.1 kg)  12/12/18 170 lb 6.4 oz (77.3 kg)  09/08/18 172 lb (78 kg)    Physical Exam Vitals signs reviewed.  Constitutional:      General: She is not in acute distress.    Appearance: She is well-developed.  HENT:     Head: Normocephalic and atraumatic.  Cardiovascular:     Rate and Rhythm: Normal rate and regular rhythm.     Pulses:          Radial pulses are 2+ on the right side and 2+ on the left side.       Posterior tibial pulses are 1+ on the right side and 1+ on the left side.     Heart sounds: Normal heart sounds,  S1 normal and S2 normal.  Pulmonary:     Effort: Pulmonary effort is normal. No respiratory distress.     Breath sounds: Normal breath sounds and air entry.  Musculoskeletal:     Right lower leg: No edema.     Left lower leg: No edema.     Left foot: Normal range of motion and normal capillary refill. Tenderness and deformity present. No bony tenderness, swelling, crepitus or laceration.       Feet:  Skin:    General: Skin is warm and dry.     Capillary Refill: Capillary refill takes less than 2 seconds.  Neurological:     General: No focal deficit present.     Mental Status: She is alert and oriented to person, place, and time.  Psychiatric:        Attention and Perception: Attention normal.        Mood and Affect: Mood and affect normal.        Behavior: Behavior normal. Behavior is cooperative.  Thought Content: Thought content normal.        Judgment: Judgment normal.      Results for orders placed or performed in visit on 09/15/18  HM MAMMOGRAPHY  Result Value Ref Range   HM Mammogram 0-4 Bi-Rad 0-4 Bi-Rad, Self Reported Normal  HM DEXA SCAN  Result Value Ref Range   HM Dexa Scan osteopenia       Assessment & Plan:   Problem List Items Addressed This Visit    None    Visit Diagnoses    Foot mass, left    -  Primary    Uncertain etiology, but likely inflammatory.  May have overuse component or may be result of prior stress fracture given history of pain starting 3 weeks ago. Inadequate conservative therapy to date.  Plan: 1. Foot xray today (LEFT) foot  2. START Aleve 440 mg bid x 14 days then prn.  Use ice after every exercise class/walking. 3. May benefit from podiatry vs sports medicine in future for evaluation/treatment. 4. Follow-up 2-4 weeks prn no improvement on conservative therapy.   Follow up plan: Return 2-4 weeks if symptoms worsen or fail to improve.  [name], DNP, AGPCNP-BC Adult Gerontology Primary Care Nurse Practitioner [facility] 06/21/2019, 11:17 AM

## 2019-06-21 NOTE — Patient Instructions (Addendum)
Ames Dura,   Thank you for coming in to clinic today.  1. Take Aleve 440 mg (two tablets) over the counter twice daily for 14 days.  2. Use ice over your foot after exercise and walking.  Please schedule a follow-up appointment with [name], AGNP. Return 2-4 weeks if symptoms worsen or fail to improve.  If you have any other questions or concerns, please feel free to call the clinic or send a message through [facility]. You may also schedule an earlier appointment if necessary.  You will receive a survey after today's visit either digitally by e-mail or paper by [employer]. Your experiences and feedback matter to [place].  Please respond so we know how we are doing as we provide care for you.   [name], DNP, AGNP-BC Adult Gerontology Nurse Practitioner [place]

## 2019-06-22 ENCOUNTER — Telehealth: Payer: Self-pay | Admitting: Nurse Practitioner

## 2019-06-22 NOTE — Telephone Encounter (Signed)
Returned call about her xrays.. Asked to speak the Ms. Merrilyn Puma.  CB#  [phone]  Thanks Con Memos

## 2019-06-22 NOTE — Telephone Encounter (Signed)
Returned patient's call.  Answered her questions.  Clinically, suspect avulsion fracture is most likely cause.  Podiatry referral has already been placed.  Patient has no additional questions.

## 2019-06-23 ENCOUNTER — Telehealth: Payer: Self-pay | Admitting: Nurse Practitioner

## 2019-06-23 NOTE — Telephone Encounter (Signed)
She is more than welcome to see orthopedics if she can get in sooner.

## 2019-06-23 NOTE — Telephone Encounter (Signed)
Pt called saying she has an appt with the podiatrist August 21st.  She wants to know if she can go to her ortho doctor if she can get in sooner.  She does not want to walk around not knowing if her ankle is broken or not  Please advise  Con Memos

## 2019-06-23 NOTE — Telephone Encounter (Signed)
The pt was notified. She verbalize understanding, no questions or concerns. 

## 2019-07-03 DIAGNOSIS — M19072 Primary osteoarthritis, left ankle and foot: Secondary | ICD-10-CM | POA: Diagnosis not present

## 2019-07-13 ENCOUNTER — Other Ambulatory Visit: Payer: Self-pay | Admitting: Family Medicine

## 2019-07-13 DIAGNOSIS — I10 Essential (primary) hypertension: Secondary | ICD-10-CM

## 2019-07-28 ENCOUNTER — Encounter: Payer: Self-pay | Admitting: Nurse Practitioner

## 2019-07-31 ENCOUNTER — Other Ambulatory Visit: Payer: Self-pay | Admitting: Nurse Practitioner

## 2019-07-31 DIAGNOSIS — Z1231 Encounter for screening mammogram for malignant neoplasm of breast: Secondary | ICD-10-CM

## 2019-08-02 DIAGNOSIS — M25561 Pain in right knee: Secondary | ICD-10-CM | POA: Diagnosis not present

## 2019-08-13 DIAGNOSIS — R69 Illness, unspecified: Secondary | ICD-10-CM | POA: Diagnosis not present

## 2019-08-23 ENCOUNTER — Ambulatory Visit (INDEPENDENT_AMBULATORY_CARE_PROVIDER_SITE_OTHER): Payer: Medicare HMO | Admitting: Nurse Practitioner

## 2019-08-23 ENCOUNTER — Encounter: Payer: Self-pay | Admitting: Nurse Practitioner

## 2019-08-23 ENCOUNTER — Other Ambulatory Visit: Payer: Self-pay

## 2019-08-23 VITALS — BP 131/59 | HR 66 | Ht 63.5 in | Wt 159.4 lb

## 2019-08-23 DIAGNOSIS — M85859 Other specified disorders of bone density and structure, unspecified thigh: Secondary | ICD-10-CM | POA: Diagnosis not present

## 2019-08-23 DIAGNOSIS — I10 Essential (primary) hypertension: Secondary | ICD-10-CM | POA: Diagnosis not present

## 2019-08-23 DIAGNOSIS — Z23 Encounter for immunization: Secondary | ICD-10-CM | POA: Diagnosis not present

## 2019-08-23 DIAGNOSIS — E782 Mixed hyperlipidemia: Secondary | ICD-10-CM | POA: Diagnosis not present

## 2019-08-23 DIAGNOSIS — S41111A Laceration without foreign body of right upper arm, initial encounter: Secondary | ICD-10-CM

## 2019-08-23 DIAGNOSIS — Z1159 Encounter for screening for other viral diseases: Secondary | ICD-10-CM | POA: Diagnosis not present

## 2019-08-23 DIAGNOSIS — Z0000 Encounter for general adult medical examination without abnormal findings: Secondary | ICD-10-CM | POA: Diagnosis not present

## 2019-08-23 DIAGNOSIS — Z13220 Encounter for screening for lipoid disorders: Secondary | ICD-10-CM | POA: Diagnosis not present

## 2019-08-23 NOTE — Progress Notes (Signed)
Subjective:    Patient ID: Robin Lang, female    DOB: May 28, 1951, 68 y.o.   MRN: [id number]  Robin Lang is a 68 y.o. female presenting on 08/23/2019 for Annual Exam  HPI Annual Physical Exam Patient has been feeling well.  They have acute concerns today low back pain and radiation down left leg.  Patient is using lidocaine patches 5% is improved significantly (12 hr on, 12 hr off).  Pain is now 24/7 for hip/back pain.  Occasionally cannot walk her dog. Sleeps 6-8 hours per night interrupted. Reads and goes back to sleep.  HEALTH MAINTENANCE: Weight/BMI: generally improved with physical activity Physical activity: cardio 3 x per week, yoga 2x per week with silver sneakers Diet: generally healthy Seatbelt: always Sunscreen: regularly Mammogram: scheduled for 11/4 DEXA: due - osteopenia with last DEXA 04/2017 Colon Cancer Screen: Patient reports no known date - Likely due 04/2020 HEP C: agrees today Optometry: every 2 years or more frequently Dentistry: every 6 mos  VACCINES: Tetanus: due Influenza: desires today   Patient has two places on skin - lacerations without swelling LEFT forearm.  Needs tdap.   Past Medical History:  Diagnosis Date  . Baker's cyst of knee, right 2015  . Diverticulosis   . Hyperlipidemia   . Hypertension   . Osteopenia    Past Surgical History:  Procedure Laterality Date  . CATARACT EXTRACTION Right 2017  . MENISCUS REPAIR Left 2012   Social History   Socioeconomic History  . Marital status: Divorced    Spouse name: Not on file  . Number of children: 2  . Years of education: Not on file  . Highest education level: Some college, no degree  Occupational History  . Not on file  Social Needs  . Financial resource strain: Not on file  . Food insecurity    Worry: Not on file    Inability: Not on file  . Transportation needs    Medical: Not on file    Non-medical: Not on file  Tobacco Use  . Smoking status: Former Smoker   Packs/day: 0.50    Years: 9.00    Pack years: 4.50    Quit date: 1989    Years since quitting: 31.7  . Smokeless tobacco: Never Used  Substance and Sexual Activity  . Alcohol use: Yes    Alcohol/week: 2.0 standard drinks    Types: 2 Glasses of wine per week  . Drug use: Never  . Sexual activity: Not Currently  Lifestyle  . Physical activity    Days per week: Not on file    Minutes per session: Not on file  . Stress: Not on file  Relationships  . Social Herbalist on phone: Not on file    Gets together: Not on file    Attends religious service: Not on file    Active member of club or organization: Not on file    Attends meetings of clubs or organizations: Not on file    Relationship status: Not on file  . Intimate partner violence    Fear of current or ex partner: Not on file    Emotionally abused: Not on file    Physically abused: Not on file    Forced sexual activity: Not on file  Other Topics Concern  . Not on file  Social History Narrative  . Not on file   Family History  Problem Relation Age of Onset  . Stroke Mother   . Renal cancer  Mother   . Heart disease Father   . Heart attack Father   . Lung cancer Sister        metastatic  . Thyroid cancer Brother        metastatic  . Heart disease Sister   . Multiple sclerosis Brother   . Breast cancer Neg Hx    Current Outpatient Medications on File Prior to Visit  Medication Sig  . co-enzyme Q-10 30 MG capsule Take 30 mg by mouth daily.   Marland Kitchen lidocaine (LIDODERM) 5 % lidocaine 5 % topical patch  APPLY 1 PATCH TOPICALLY ONCE DAILY (MAY WEAR FOR 12 HOURS AND REMOVE FOR 12 HOURS)  . losartan (COZAAR) 100 MG tablet TAKE 1 TABLET BY MOUTH ONCE DAILY -  NEED  APPOINTMENT  . meloxicam (MOBIC) 15 MG tablet TAKE 1 TABLET BY MOUTH ONCE DAILY AFTER A MEAL  . Multiple Vitamins-Minerals (CENTRUM WOMEN) TABS Take by mouth.  . simvastatin (ZOCOR) 10 MG tablet Take 1 tablet by mouth once daily  .  triamterene-hydrochlorothiazide (MAXZIDE) 75-50 MG tablet Take 1/2 (one-half) tablet by mouth once daily  . verapamil (CALAN) 120 MG tablet Take 1 tablet by mouth twice daily   No current facility-administered medications on file prior to visit.     Review of Systems  Constitutional: Negative for chills and fever.  HENT: Negative for congestion and sore throat.   Eyes: Negative for pain.  Respiratory: Negative for cough, shortness of breath and wheezing.   Cardiovascular: Negative for chest pain, palpitations and leg swelling.  Gastrointestinal: Negative for abdominal pain, blood in stool, constipation, diarrhea, nausea and vomiting.  Endocrine: Negative for polydipsia.  Genitourinary: Negative for dysuria, frequency, hematuria and urgency.  Musculoskeletal: Positive for arthralgias. Negative for back pain, myalgias and neck pain.  Skin: Negative.  Negative for rash.  Allergic/Immunologic: Negative for environmental allergies.  Neurological: Negative for dizziness, weakness and headaches.  Hematological: Does not bruise/bleed easily.  Psychiatric/Behavioral: Negative for dysphoric mood and suicidal ideas. The patient is not nervous/anxious.    Per HPI unless specifically indicated above     Objective:    BP (!) 131/59 (BP Location: Right Arm, Patient Position: Sitting, Cuff Size: Normal)   Pulse 66   Ht 5' 3.5" (1.613 m)   Wt 159 lb 6.4 oz (72.3 kg)   BMI 27.79 kg/m   Wt Readings from Last 3 Encounters:  08/23/19 159 lb 6.4 oz (72.3 kg)  06/21/19 156 lb 12.8 oz (71.1 kg)  12/12/18 170 lb 6.4 oz (77.3 kg)    Physical Exam Vitals signs and nursing note reviewed.  Constitutional:      General: She is not in acute distress.    Appearance: Normal appearance. She is well-developed.  HENT:     Head: Normocephalic and atraumatic.     Right Ear: Tympanic membrane, ear canal and external ear normal.     Left Ear: Tympanic membrane, ear canal and external ear normal.     Nose:  Nose normal.     Mouth/Throat:     Mouth: Mucous membranes are moist.     Pharynx: Oropharynx is clear.  Eyes:     Conjunctiva/sclera: Conjunctivae normal.     Pupils: Pupils are equal, round, and reactive to light.  Neck:     Musculoskeletal: Normal range of motion and neck supple.     Thyroid: No thyromegaly.     Vascular: No JVD.     Trachea: No tracheal deviation.  Cardiovascular:     Rate  and Rhythm: Normal rate and regular rhythm.     Heart sounds: Normal heart sounds. No murmur. No friction rub. No gallop.   Pulmonary:     Effort: Pulmonary effort is normal. No respiratory distress.     Breath sounds: Normal breath sounds.  Abdominal:     General: Abdomen is flat. Bowel sounds are normal. There is no distension.     Palpations: Abdomen is soft.     Tenderness: There is no abdominal tenderness.  Musculoskeletal: Normal range of motion.  Lymphadenopathy:     Cervical: No cervical adenopathy.  Skin:    General: Skin is warm and dry.     Capillary Refill: Capillary refill takes less than 2 seconds.     Comments: Right forearm laceration x 2 < 1 inch, scabbed over, no periwound changes  Neurological:     General: No focal deficit present.     Mental Status: She is alert and oriented to person, place, and time. Mental status is at baseline.     Cranial Nerves: No cranial nerve deficit.  Psychiatric:        Mood and Affect: Mood normal.        Behavior: Behavior normal.        Thought Content: Thought content normal.        Judgment: Judgment normal.    Results for orders placed or performed in visit on 09/15/18  HM MAMMOGRAPHY  Result Value Ref Range   HM Mammogram 0-4 Bi-Rad 0-4 Bi-Rad, Self Reported Normal  HM DEXA SCAN  Result Value Ref Range   HM Dexa Scan osteopenia       Assessment & Plan:   Problem List Items Addressed This Visit      Cardiovascular and Mediastinum   Essential hypertension   Relevant Orders   Comprehensive metabolic panel (Completed)      Other   Mixed hyperlipidemia   Relevant Orders   Comprehensive metabolic panel (Completed)   Lipid panel (Completed)    Other Visit Diagnoses    Lipid screening    -  Primary   Encounter for hepatitis C screening test for low risk patient       Relevant Orders   Hepatitis C antibody (Completed)   Arm laceration, right, initial encounter       Relevant Orders   Tdap vaccine greater than or equal to 7yo IM (Completed)   Osteopenia of neck of femur, unspecified laterality       Relevant Orders   DG Bone Density      Annual physical exam with new findings of left forearm laceration.  Well adult with no acute concerns. - Osteopenia stable on HPI/exam without any new fractures since last visit.  Maintains healthy diet and regular weight bearing activity - Hyperlipidemia: stable without any ASCVD events since last visit - Hypertenion: stable and well controlled on medications  Plan: 1. Obtain health maintenance screenings as above according to age. - Increase physical activity to 30 minutes most days of the week.  - Eat healthy diet high in vegetables and fruits; low in refined carbohydrates. - Screening labs and tests as ordered 2. Refill medications today for lipid, hypertension  3. Tdap for lacerations today 4. Repeat DEXA for osteoporosis evaluation 5. Return 1 year for annual physical.    Follow up plan: Return in about 6 months (around 02/21/2020) for cholesterol, hypertension AND 1 year annual physical.  [name], DNP, AGPCNP-BC Adult Gerontology Primary Care Nurse Practitioner [facility]  [facility] 08/23/2019, 10:13 AM

## 2019-08-23 NOTE — Patient Instructions (Addendum)
[facility] Dura,   Thank you for coming in to clinic today.  1. Continue your healthy active lifestyle.  2. Your RIGHT groin lump is likely a lipoma or a cyst.  These are benign.  3. Your Dexa scan order has been placed.  Call the Scheduling phone number at [phone] to schedule your mammogram at your convenience.  You can choose to go to either location listed below.  Let the scheduler know which location you prefer.  [place]  [place], [place] [address]   [facility] [address] [place], [place] [address]   Please schedule a follow-up appointment. Return in about 6 months (around 02/21/2020) for cholesterol, hypertension AND 1 year annual physical.  If you have any other questions or concerns, please feel free to call the clinic or send a message through [place]. You may also schedule an earlier appointment if necessary.  You will receive a survey after today's visit either digitally by e-mail or paper by [employer]. Your experiences and feedback matter to [place].  Please respond so we know how we are doing as we provide care for you.  [name], DNP, AGNP-BC Adult Gerontology Nurse Practitioner [facility], [facility] Influenza (Flu) Vaccine (Inactivated or Recombinant): What You Need to Know 1. Why get vaccinated? Influenza vaccine can prevent influenza (flu). Flu is a contagious disease that spreads around the [place] every year, usually between October and May. Anyone can get the flu, but it is more dangerous for some people. Infants and young children, people 67 years of age and older, pregnant women, and people with certain health conditions or a weakened immune system are at greatest risk of flu complications. Pneumonia, bronchitis, sinus infections and ear infections are examples of flu-related complications. If you have a medical condition,  such as heart disease, cancer or diabetes, flu can make it worse. Flu can cause fever and chills, sore throat, muscle aches, fatigue, cough, headache, and runny or stuffy nose. Some people may have vomiting and diarrhea, though this is more common in children than adults. Each year thousands of people in the [place] die from flu, and many more are hospitalized. Flu vaccine prevents millions of illnesses and flu-related visits to the doctor each year. 2. Influenza vaccine CDC recommends everyone 49 months of age and older get vaccinated every flu season. Children 6 months through 33 years of age may need 2 doses during a single flu season. Everyone else needs only 1 dose each flu season. It takes about 2 weeks for protection to develop after vaccination. There are many flu viruses, and they are always changing. Each year a new flu vaccine is made to protect against three or four viruses that are likely to cause disease in the upcoming flu season. Even when the vaccine doesn't exactly match these viruses, it may still provide some protection. Influenza vaccine does not cause flu. Influenza vaccine may be given at the same time as other vaccines. 3. Talk with your health care provider Tell your vaccine provider if the person getting the vaccine:  Has had an allergic reaction after a previous dose of influenza vaccine, or has any severe, life-threatening allergies.  Has ever had Guillain-Barr Syndrome (also called GBS). In some cases, your health care provider may decide to postpone influenza vaccination to a future visit. People with minor illnesses, such as a cold, may be vaccinated. People who are  moderately or severely ill should usually wait until they recover before getting influenza vaccine. Your health care provider can give you more information. 4. Risks of a vaccine reaction  Soreness, redness, and swelling where shot is given, fever, muscle aches, and headache can happen after  influenza vaccine.  There may be a very small increased risk of Guillain-Barr Syndrome (GBS) after inactivated influenza vaccine (the flu shot). Young children who get the flu shot along with pneumococcal vaccine (PCV13), and/or DTaP vaccine at the same time might be slightly more likely to have a seizure caused by fever. Tell your health care provider if a child who is getting flu vaccine has ever had a seizure. People sometimes faint after medical procedures, including vaccination. Tell your provider if you feel dizzy or have vision changes or ringing in the ears. As with any medicine, there is a very remote chance of a vaccine causing a severe allergic reaction, other serious injury, or death. 5. What if there is a serious problem? An allergic reaction could occur after the vaccinated person leaves the clinic. If you see signs of a severe allergic reaction (hives, swelling of the face and throat, difficulty breathing, a fast heartbeat, dizziness, or weakness), call 9-1-1 and get the person to the nearest hospital. For other signs that concern you, call your health care provider. Adverse reactions should be reported to the Vaccine Adverse Event Reporting System (VAERS). Your health care provider will usually file this report, or you can do it yourself. Visit the VAERS website at www.vaers.SamedayNews.es or call 267-461-2884.VAERS is only for reporting reactions, and VAERS staff do not give medical advice. 6. The National Vaccine Injury Compensation Program The [employer] Vaccine Injury Compensation Program (VICP) is a federal program that was created to compensate people who may have been injured by certain vaccines. Visit the VICP website at GoldCloset.com.ee or call 682-070-5809 to learn about the program and about filing a claim. There is a time limit to file a claim for compensation. 7. How can I learn more?  Ask your healthcare provider.  Call your local or state health  department.  Contact the Centers for Disease Control and Prevention (CDC): ? Call (224)849-6144 (1-800-CDC-INFO) or ? Visit CDC's https://gibson.com/ Vaccine Information Statement (Interim) Inactivated Influenza Vaccine (06/30/2018) This information is not intended to replace advice given to you by your health care provider. Make sure you discuss any questions you have with your health care provider. Document Released: 08/27/2006 Document Revised: 02/21/2019 Document Reviewed: 07/04/2018 Elsevier Patient Education  [place]. https://www.cdc.gov/vaccines/hcp/vis/vis-statements/tdap.pdf">  Tdap Vaccine (Tetanus, Diphtheria and Pertussis): What You Need to Know 1. Why get vaccinated? Tetanus, diphtheria and pertussis are very serious diseases. Tdap vaccine can protect [place] from these diseases. And, Tdap vaccine given to pregnant women can protect newborn babies against pertussis.Marland Kitchen TETANUS (Lockjaw) is rare in the [place] today. It causes painful muscle tightening and stiffness, usually all over the body.  It can lead to tightening of muscles in the head and neck so you can't open your mouth, swallow, or sometimes even breathe. Tetanus kills about 1 out of 10 people who are infected even after receiving the best medical care. DIPHTHERIA is also rare in the [place] today. It can cause a thick coating to form in the back of the throat.  It can lead to breathing problems, heart failure, paralysis, and death. PERTUSSIS (Whooping Cough) causes severe coughing spells, which can cause difficulty breathing, vomiting and disturbed sleep.  It can also lead to  weight loss, incontinence, and rib fractures. Up to 2 in 100 adolescents and 5 in 100 adults with pertussis are hospitalized or have complications, which could include pneumonia or death. These diseases are caused by bacteria. Diphtheria and pertussis are spread from person to person through secretions from coughing or sneezing. Tetanus  enters the body through cuts, scratches, or wounds. Before vaccines, as many as 200,000 cases of diphtheria, 200,000 cases of pertussis, and hundreds of cases of tetanus, were reported in the [place] each year. Since vaccination began, reports of cases for tetanus and diphtheria have dropped by about 99% and for pertussis by about 80%. 2. Tdap vaccine Tdap vaccine can protect adolescents and adults from tetanus, diphtheria, and pertussis. One dose of Tdap is routinely given at age 10 or 76. People who did not get Tdap at that age should get it as soon as possible. Tdap is especially important for healthcare professionals and anyone having close contact with a baby younger than 12 months. Pregnant women should get a dose of Tdap during every pregnancy, to protect the newborn from pertussis. Infants are most at risk for severe, life-threatening complications from pertussis. Another vaccine, called Td, protects against tetanus and diphtheria, but not pertussis. A Td booster should be given every 10 years. Tdap may be given as one of these boosters if you have never gotten Tdap before. Tdap may also be given after a severe cut or burn to prevent tetanus infection. Your doctor or the person giving you the vaccine can give you more information. Tdap may safely be given at the same time as other vaccines. 3. Some people should not get this vaccine  A person who has ever had a life-threatening allergic reaction after a previous dose of any diphtheria, tetanus or pertussis containing vaccine, OR has a severe allergy to any part of this vaccine, should not get Tdap vaccine. Tell the person giving the vaccine about any severe allergies.  Anyone who had coma or long repeated seizures within 7 days after a childhood dose of DTP or DTaP, or a previous dose of Tdap, should not get Tdap, unless a cause other than the vaccine was found. They can still get Td.  Talk to your doctor if you: ? have seizures or  another nervous system problem, ? had severe pain or swelling after any vaccine containing diphtheria, tetanus or pertussis, ? ever had a condition called Guillain-Barr Syndrome (GBS), ? aren't feeling well on the day the shot is scheduled. 4. Risks With any medicine, including vaccines, there is a chance of side effects. These are usually mild and go away on their own. Serious reactions are also possible but are rare. Most people who get Tdap vaccine do not have any problems with it. Mild problems following Tdap (Did not interfere with activities)  Pain where the shot was given (about 3 in 4 adolescents or 2 in 3 adults)  Redness or swelling where the shot was given (about 1 person in 5)  Mild fever of at least 100.18F (up to about 1 in 25 adolescents or 1 in 100 adults)  Headache (about 3 or 4 people in 10)  Tiredness (about 1 person in 3 or 4)  Nausea, vomiting, diarrhea, stomach ache (up to 1 in 4 adolescents or 1 in 10 adults)  Chills, sore joints (about 1 person in 10)  Body aches (about 1 person in 3 or 4)  Rash, swollen glands (uncommon) Moderate problems following Tdap (Interfered with activities, but did  not require medical attention)  Pain where the shot was given (up to 1 in 5 or 6)  Redness or swelling where the shot was given (up to about 1 in 16 adolescents or 1 in 12 adults)  Fever over 102F (about 1 in 100 adolescents or 1 in 250 adults)  Headache (about 1 in 7 adolescents or 1 in 10 adults)  Nausea, vomiting, diarrhea, stomach ache (up to 1 or 3 people in 100)  Swelling of the entire arm where the shot was given (up to about 1 in 500). Severe problems following Tdap (Unable to perform usual activities; required medical attention)  Swelling, severe pain, bleeding and redness in the arm where the shot was given (rare). Problems that could happen after any vaccine:  People sometimes faint after a medical procedure, including vaccination. Sitting or  lying down for about 15 minutes can help prevent fainting, and injuries caused by a fall. Tell your doctor if you feel dizzy, or have vision changes or ringing in the ears.  Some people get severe pain in the shoulder and have difficulty moving the arm where a shot was given. This happens very rarely.  Any medication can cause a severe allergic reaction. Such reactions from a vaccine are very rare, estimated at fewer than 1 in a million doses, and would happen within a few minutes to a few hours after the vaccination. As with any medicine, there is a very remote chance of a vaccine causing a serious injury or death. The safety of vaccines is always being monitored. For more information, visit: http://www.aguilar.org/ 5. What if there is a serious problem? What should I look for?  Look for anything that concerns you, such as signs of a severe allergic reaction, very high fever, or unusual behavior. Signs of a severe allergic reaction can include hives, swelling of the face and throat, difficulty breathing, a fast heartbeat, dizziness, and weakness. These would usually start a few minutes to a few hours after the vaccination. What should I do?  If you think it is a severe allergic reaction or other emergency that can't wait, call 9-1-1 or get the person to the nearest hospital. Otherwise, call your doctor.  Afterward, the reaction should be reported to the Vaccine Adverse Event Reporting System (VAERS). Your doctor might file this report, or you can do it yourself through the VAERS web site at www.vaers.SamedayNews.es, or by calling 726-046-9583. VAERS does not give medical advice. 6. The National Vaccine Injury Compensation Program The [employer] Vaccine Injury Compensation Program (VICP) is a federal program that was created to compensate people who may have been injured by certain vaccines. Persons who believe they may have been injured by a vaccine can learn about the program and about filing a  claim by calling 334-537-2989 or visiting the [place] website at GoldCloset.com.ee. There is a time limit to file a claim for compensation. 7. How can I learn more?  Ask your doctor. He or she can give you the vaccine package insert or suggest other sources of information.  Call your local or state health department.  Contact the Centers for Disease Control and Prevention (CDC): ? Call 2195268973 (1-800-CDC-INFO) or ? Visit CDC's website at http://hunter.com/ Vaccine Information Statement Tdap Vaccine (01/09/2014) This information is not intended to replace advice given to you by your health care provider. Make sure you discuss any questions you have with your health care provider. Document Released: 05/03/2012 Document Revised: 06/20/2018 Document Reviewed: 06/20/2018 Elsevier Interactive Patient Education  2020 Elsevier Inc.  

## 2019-08-24 DIAGNOSIS — Z1159 Encounter for screening for other viral diseases: Secondary | ICD-10-CM | POA: Diagnosis not present

## 2019-08-24 DIAGNOSIS — I10 Essential (primary) hypertension: Secondary | ICD-10-CM | POA: Diagnosis not present

## 2019-08-24 DIAGNOSIS — E782 Mixed hyperlipidemia: Secondary | ICD-10-CM | POA: Diagnosis not present

## 2019-08-25 LAB — LIPID PANEL
Chol/HDL Ratio: 2.2 ratio (ref 0.0–4.4)
Cholesterol, Total: 223 mg/dL — ABNORMAL HIGH (ref 100–199)
HDL: 101 mg/dL (ref >39)
LDL Chol Calc (NIH): 107 mg/dL — ABNORMAL HIGH (ref 0–99)
Triglycerides: 88 mg/dL (ref 0–149)
VLDL Cholesterol Cal: 15 mg/dL (ref 5–40)

## 2019-08-25 LAB — COMPREHENSIVE METABOLIC PANEL
ALT: 26 IU/L (ref 0–32)
AST: 25 IU/L (ref 0–40)
Albumin/Globulin Ratio: 2.1 (ref 1.2–2.2)
Albumin: 4.8 g/dL (ref 3.8–4.8)
Alkaline Phosphatase: 77 IU/L (ref 39–117)
BUN/Creatinine Ratio: 22 (ref 12–28)
BUN: 22 mg/dL (ref 8–27)
Bilirubin Total: 0.6 mg/dL (ref 0.0–1.2)
CO2: 20 mmol/L (ref 20–29)
Calcium: 10.3 mg/dL (ref 8.7–10.3)
Chloride: 101 mmol/L (ref 96–106)
Creatinine, Ser: 1.01 mg/dL — ABNORMAL HIGH (ref 0.57–1.00)
GFR calc Af Amer: 66 mL/min/{1.73_m2} (ref >59)
GFR calc non Af Amer: 57 mL/min/{1.73_m2} — ABNORMAL LOW (ref >59)
Globulin, Total: 2.3 g/dL (ref 1.5–4.5)
Glucose: 114 mg/dL — ABNORMAL HIGH (ref 65–99)
Potassium: 3.7 mmol/L (ref 3.5–5.2)
Sodium: 137 mmol/L (ref 134–144)
Total Protein: 7.1 g/dL (ref 6.0–8.5)

## 2019-08-25 LAB — HEPATITIS C ANTIBODY: Hep C Virus Ab: <0.1 s/co ratio (ref 0.0–0.9)

## 2019-08-28 ENCOUNTER — Other Ambulatory Visit: Payer: Self-pay | Admitting: Nurse Practitioner

## 2019-08-28 ENCOUNTER — Other Ambulatory Visit: Payer: Self-pay | Admitting: Family Medicine

## 2019-08-28 DIAGNOSIS — I10 Essential (primary) hypertension: Secondary | ICD-10-CM

## 2019-08-28 DIAGNOSIS — E782 Mixed hyperlipidemia: Secondary | ICD-10-CM

## 2019-09-03 ENCOUNTER — Encounter: Payer: Self-pay | Admitting: Nurse Practitioner

## 2019-09-06 DIAGNOSIS — M7062 Trochanteric bursitis, left hip: Secondary | ICD-10-CM | POA: Diagnosis not present

## 2019-09-20 ENCOUNTER — Ambulatory Visit
Admission: RE | Admit: 2019-09-20 | Discharge: 2019-09-20 | Disposition: A | Discharge status: Home / Self Care | Payer: Medicare HMO | Source: Ambulatory Visit | Attending: Nurse Practitioner | Admitting: Nurse Practitioner

## 2019-09-20 DIAGNOSIS — Z13820 Encounter for screening for osteoporosis: Secondary | ICD-10-CM | POA: Insufficient documentation

## 2019-09-20 DIAGNOSIS — Z1231 Encounter for screening mammogram for malignant neoplasm of breast: Secondary | ICD-10-CM

## 2019-09-20 DIAGNOSIS — M8580 Other specified disorders of bone density and structure, unspecified site: Secondary | ICD-10-CM | POA: Diagnosis not present

## 2019-09-20 DIAGNOSIS — Z780 Asymptomatic menopausal state: Secondary | ICD-10-CM | POA: Insufficient documentation

## 2019-09-20 DIAGNOSIS — M85859 Other specified disorders of bone density and structure, unspecified thigh: Secondary | ICD-10-CM | POA: Insufficient documentation

## 2019-09-20 DIAGNOSIS — M8589 Other specified disorders of bone density and structure, multiple sites: Secondary | ICD-10-CM | POA: Diagnosis not present

## 2019-09-27 DIAGNOSIS — M7121 Synovial cyst of popliteal space [Baker], right knee: Secondary | ICD-10-CM | POA: Diagnosis not present

## 2019-09-27 DIAGNOSIS — M7062 Trochanteric bursitis, left hip: Secondary | ICD-10-CM | POA: Diagnosis not present

## 2019-10-28 ENCOUNTER — Other Ambulatory Visit: Payer: Self-pay | Admitting: Nurse Practitioner

## 2019-10-28 DIAGNOSIS — I10 Essential (primary) hypertension: Secondary | ICD-10-CM

## 2019-10-30 MED ORDER — LOSARTAN POTASSIUM 100 MG PO TABS: 100.0000 mg | ORAL_TABLET | Freq: Every day | ORAL | 1 refills | Status: DC

## 2019-10-30 NOTE — Addendum Note (Signed)
Addended by: Olin Hauser on: 10/30/2019 04:45 PM   Modules accepted: Orders

## 2019-11-01 ENCOUNTER — Other Ambulatory Visit: Payer: Self-pay

## 2019-11-22 DIAGNOSIS — M7062 Trochanteric bursitis, left hip: Secondary | ICD-10-CM | POA: Diagnosis not present

## 2019-11-22 DIAGNOSIS — M170 Bilateral primary osteoarthritis of knee: Secondary | ICD-10-CM | POA: Diagnosis not present

## 2019-11-22 DIAGNOSIS — M1712 Unilateral primary osteoarthritis, left knee: Secondary | ICD-10-CM | POA: Diagnosis not present

## 2019-11-22 DIAGNOSIS — M7061 Trochanteric bursitis, right hip: Secondary | ICD-10-CM | POA: Diagnosis not present

## 2019-11-24 ENCOUNTER — Other Ambulatory Visit: Payer: Self-pay | Admitting: Nurse Practitioner

## 2019-11-24 DIAGNOSIS — I10 Essential (primary) hypertension: Secondary | ICD-10-CM

## 2019-12-11 ENCOUNTER — Encounter: Payer: Self-pay | Admitting: Family Medicine

## 2019-12-11 ENCOUNTER — Ambulatory Visit (INDEPENDENT_AMBULATORY_CARE_PROVIDER_SITE_OTHER): Payer: Medicare HMO | Admitting: Family Medicine

## 2019-12-11 ENCOUNTER — Other Ambulatory Visit: Payer: Self-pay

## 2019-12-11 VITALS — BP 145/68 | HR 63 | Temp 97.5°F | Resp 16 | Ht 63.5 in | Wt 146.0 lb

## 2019-12-11 DIAGNOSIS — R110 Nausea: Secondary | ICD-10-CM

## 2019-12-11 DIAGNOSIS — K219 Gastro-esophageal reflux disease without esophagitis: Secondary | ICD-10-CM

## 2019-12-11 DIAGNOSIS — R1013 Epigastric pain: Secondary | ICD-10-CM

## 2019-12-11 DIAGNOSIS — R140 Abdominal distension (gaseous): Secondary | ICD-10-CM | POA: Diagnosis not present

## 2019-12-11 LAB — CBC WITH DIFFERENTIAL/PLATELET
Basophils Absolute: 0 10*3/uL (ref 0.0–0.2)
Basos: 0 %
EOS (ABSOLUTE): 0 10*3/uL (ref 0.0–0.4)
Eos: 0 %
Hematocrit: 39 % (ref 34.0–46.6)
Hemoglobin: 13.3 g/dL (ref 11.1–15.9)
Immature Grans (Abs): 0 10*3/uL (ref 0.0–0.1)
Immature Granulocytes: 0 %
Lymphocytes Absolute: 1.1 10*3/uL (ref 0.7–3.1)
Lymphs: 14 %
MCH: 31.2 pg (ref 26.6–33.0)
MCHC: 34.1 g/dL (ref 31.5–35.7)
MCV: 92 fL (ref 79–97)
Monocytes Absolute: 0.7 10*3/uL (ref 0.1–0.9)
Monocytes: 9 %
Neutrophils Absolute: 5.8 10*3/uL (ref 1.4–7.0)
Neutrophils: 77 %
Platelets: 237 10*3/uL (ref 150–450)
RBC: 4.26 x10E6/uL (ref 3.77–5.28)
RDW: 12.1 % (ref 11.7–15.4)
WBC: 7.6 10*3/uL (ref 3.4–10.8)

## 2019-12-11 LAB — COMPREHENSIVE METABOLIC PANEL
ALT: 18 IU/L (ref 0–32)
AST: 15 IU/L (ref 0–40)
Albumin/Globulin Ratio: 1.7 (ref 1.2–2.2)
Albumin: 4 g/dL (ref 3.8–4.8)
Alkaline Phosphatase: 63 IU/L (ref 39–117)
BUN/Creatinine Ratio: 17 (ref 12–28)
BUN: 20 mg/dL (ref 8–27)
Bilirubin Total: 0.4 mg/dL (ref 0.0–1.2)
CO2: 23 mmol/L (ref 20–29)
Calcium: 10.5 mg/dL — ABNORMAL HIGH (ref 8.7–10.3)
Chloride: 106 mmol/L (ref 96–106)
Creatinine, Ser: 1.19 mg/dL — ABNORMAL HIGH (ref 0.57–1.00)
GFR calc Af Amer: 54 mL/min/{1.73_m2} — ABNORMAL LOW (ref >59)
GFR calc non Af Amer: 47 mL/min/{1.73_m2} — ABNORMAL LOW (ref >59)
Globulin, Total: 2.4 g/dL (ref 1.5–4.5)
Glucose: 101 mg/dL — ABNORMAL HIGH (ref 65–99)
Potassium: 3.9 mmol/L (ref 3.5–5.2)
Sodium: 143 mmol/L (ref 134–144)
Total Protein: 6.4 g/dL (ref 6.0–8.5)

## 2019-12-11 LAB — LIPASE: Lipase: 58 U/L (ref 14–72)

## 2019-12-11 MED ORDER — SUCRALFATE 1 G PO TABS: 1.0000 g | ORAL_TABLET | Freq: Three times a day (TID) | ORAL | 1 refills | Status: DC

## 2019-12-11 MED ORDER — ONDANSETRON 4 MG PO TBDP: 4.0000 mg | ORAL_TABLET | Freq: Three times a day (TID) | ORAL | 0 refills | Status: DC | PRN

## 2019-12-11 MED ORDER — OMEPRAZOLE 40 MG PO CPDR: 40.0000 mg | DELAYED_RELEASE_CAPSULE | Freq: Every day | ORAL | 2 refills | Status: DC

## 2019-12-11 NOTE — Patient Instructions (Addendum)
Thank you for coming to the office today.  I am not sure the exact cause of your abdominal pain, however I am concerned that one significant possibility could be uncontrolled Acid Reflux (GERD) and may have developed an Ulcer (Peptic Ulcer of stomach).  After you have completed your H Pylori Breath Test today, we can start with the prescribed medicines, and we will notify you of your result and if we have to change treatment.  IF BREATH TEST = NEGATIVE (or waiting for results): Starting today before next mean take Omeprazole 40mg  daily. Prefer to take this med about 30 min before breakfast or 1st meal of day for 4 weeks, don't stop taking unless we discuss first. Probably will need for about 8 weeks total if this ends up being an Ulcer.  Take other prescribed medicine Carafate (Sucralfate) as needed up to 4 times daily (3 meals and bedtime)  to coat stomach lining to ease symptoms, if it helps reduce symptoms then it is more likely to be due to acid and/or ulcer.  IF BREATH TEST = POSITIVE (we notified you of this result): - CHANGE Protonix dose from 40mg  daily to 40mg  (one pill) TWICE daily, about 30 min before breakfast and dinner - FOR TWO WEEKS, then resume DAILY treatment - Also we will send in TWO antibiotics to take IN ADDITION: - Amoxicillin 1g TWICE daily / Clarithromycin 500mg  TWICE daily - both for 2 weeks  DIET RECOMMENDATIONS - Avoid spicy, greasy, fried foods, also things like caffeine, dark chocolate, peppermint can worsen - Avoid large meals and late night snacks, also do not go more than 4-5 hours without a snack or meal (not eating will worsen reflux symptoms due to stomach acid) - You may also elevate the head of your bed at night to sleep at very slight incline to help reduce symptoms  If the problem improves but keeps coming back, we can discuss higher dose or longer course at next visit.  If symptoms are worsening or persistent despite treatment or develop any  different severe esophagus or abdominal pain, unable to swallow solids or liquids, nausea, vomiting especially blood in vomit, fever/chills, or unintentional weight loss / no appetite, please follow-up sooner in office or seek more immediate medical attention at hospital Emergency Department.  Regarding other medicines:  - STOP taking Ibuprofen, Advil, Motrin, Goody's / BC powder - DO NOT take without discussing with your doctor. These medicines can put you at high risk for future bleeding.  If need pain medicine, may take Tylenol Extra Strength (Acetaminophen) 500mg  tabs - take 1 to 2 tabs per dose (max 1000mg ) every 6-8 hours for pain (take regularly, don't skip a dose for next 7 days), max 24 hour daily dose is 6 tablets or 3000mg . In the future you can repeat the same everyday Tylenol course for 1-2 weeks at a time.  Call if not improving we can change our treatment plan and contact us if you need we can cover for diverticulitis or other.  Please schedule a Follow-up Appointment to: Return in about 4 weeks (around 01/08/2020), or if symptoms worsen or fail to improve, for abdominal pain, GERD.  If you have any other questions or concerns, please feel free to call the office or send a message through [place]. You may also schedule an earlier appointment if necessary.  Additionally, you may be receiving a survey about your experience at our office within a few days to 1 week by e-mail or mail. We value your  feedback.  Nobie Putnam, DO [place]

## 2019-12-11 NOTE — Progress Notes (Addendum)
Subjective:    Patient ID: Robin Lang, female    DOB: February 28, 1951, 69 y.o.   MRN: [id number]  Robin Lang is a 69 y.o. female presenting on 12/11/2019 for Abdominal Pain (onset 2 and half month, nausea but denies diarrhea and constipation) and Weight Loss   HPI   Previous PCP [name], AGPCNP-BC   ABDOMINAL PAIN / GERD vs PUD History of Diverticulitis Today reports new problem, did not discuss with prior provider as it onset since 08/2019 Describes course, that at end of October 2020, she started taking Vitamin D, C and Zinc. She described symptoms about 1 month later she had some dyspepsia upset stomach symptoms. She stopped all supplements and then her digestive symptoms have not resolved. She continued regular rx medications - Now she describes abdominal pain epigastric seems to be fairly constant moderate pain and can be more increased or severe following meals, bloating, and middle lower abdomen, associated with burning, burping, gas, nauseated. Seems to be gradually worsening over 3 months. It has reduced her appetite and changed her diet, but able to keep food down not having vomiting. She describes her diet, coffee in AM, fruit, soup, vegetables, macaroni and cheese. - Not taken any PPI in past. - Admits unintentional weight loss now in about 3 months, about 13 lbs by her report - Additional history followed by Orthopedic for trochanteric bursitis, she was limited on exercising due to that so she was no longer exercising 3-4 days a week anymore due to that. - Not taking Aleve Advil Ibuprofen NSAID or meloxicam. - Prior history 2015 and 2017 similar symptoms with episodes, she has been treated by prior out of state provider in [place] and [place]. She had CT in 2015 and then repeat in 2017, she also had colonoscopy done in 2015, showed diverticulosis and was advised recall in 10 years, based on benign polyps. Also had abnormality seen on CT in bladder but had Urologist do  cystoscopy was negative, false alarm. - Denies any dark stools, blood in stool, diarrhea, constipation, fever chills   Depression screen [facility] 2/9 12/11/2019 08/23/2019 07/27/2018  Decreased Interest 0 0 0  Down, Depressed, Hopeless 0 0 0  PHQ - 2 Score 0 0 0    Social History   Tobacco Use  . Smoking status: Former Smoker    Packs/day: 0.50    Years: 9.00    Pack years: 4.50    Quit date: 1989    Years since quitting: 32.0  . Smokeless tobacco: Never Used  Substance Use Topics  . Alcohol use: Yes    Alcohol/week: 2.0 standard drinks    Types: 2 Glasses of wine per week    Comment: occ  . Drug use: Never    Review of Systems Per HPI unless specifically indicated above     Objective:    BP (!) 145/68   Pulse 63   Temp (!) 97.5 F (36.4 C) (Oral)   Resp 16   Ht 5' 3.5" (1.613 m)   Wt 146 lb (66.2 kg)   BMI 25.46 kg/m   Wt Readings from Last 3 Encounters:  12/11/19 146 lb (66.2 kg)  08/23/19 159 lb 6.4 oz (72.3 kg)  06/21/19 156 lb 12.8 oz (71.1 kg)    Physical Exam Vitals and nursing note reviewed.  Constitutional:      General: She is not in acute distress.    Appearance: She is well-developed. She is not diaphoretic.     Comments: Well-appearing, comfortable, cooperative  HENT:     Head: Normocephalic and atraumatic.  Eyes:     General:        Right eye: No discharge.        Left eye: No discharge.     Conjunctiva/sclera: Conjunctivae normal.  Neck:     Thyroid: No thyromegaly.  Cardiovascular:     Rate and Rhythm: Normal rate and regular rhythm.     Heart sounds: Normal heart sounds. No murmur.  Pulmonary:     Effort: Pulmonary effort is normal. No respiratory distress.     Breath sounds: Normal breath sounds. No wheezing or rales.  Abdominal:     General: Abdomen is flat. Bowel sounds are normal. There is no distension.     Palpations: Abdomen is soft. There is no hepatomegaly, splenomegaly or mass.     Tenderness: There is abdominal tenderness in  the epigastric area and periumbilical area. There is no right CVA tenderness, left CVA tenderness, guarding or rebound. Negative signs include Murphy's sign, Rovsing's sign and McBurney's sign.     Hernia: No hernia is present.  Musculoskeletal:        General: Normal range of motion.     Cervical back: Normal range of motion and neck supple.  Lymphadenopathy:     Cervical: No cervical adenopathy.  Skin:    General: Skin is warm and dry.     Findings: No erythema or rash.  Neurological:     Mental Status: She is alert and oriented to person, place, and time.  Psychiatric:        Behavior: Behavior normal.     Comments: Well groomed, good eye contact, normal speech and thoughts    Results for orders placed or performed in visit on 12/11/19  HM COLONOSCOPY  Result Value Ref Range   HM Colonoscopy See Report (in chart) See Report (in chart), Patient Reported      Assessment & Plan:   Problem List Items Addressed This Visit    None    Visit Diagnoses    Epigastric pain    -  Primary   Relevant Medications   sucralfate (CARAFATE) 1 g tablet   omeprazole (PRILOSEC) 40 MG capsule   Other Relevant Orders   CBC with Differential/Platelet   H. pylori breath test   Lipase   Comprehensive metabolic panel   Gastroesophageal reflux disease, unspecified whether esophagitis present       Relevant Medications   sucralfate (CARAFATE) 1 g tablet   ondansetron (ZOFRAN ODT) 4 MG disintegrating tablet   omeprazole (PRILOSEC) 40 MG capsule   Other Relevant Orders   CBC with Differential/Platelet   H. pylori breath test   Comprehensive metabolic panel   Nausea       Relevant Medications   ondansetron (ZOFRAN ODT) 4 MG disintegrating tablet   Abdominal bloating          Suspect new diagnosis acute on chronic flare GERD Gastritis vs PUD Based on course and history currently most suggestive No prior history. Not tried treatment yet. Not on oral NSAIDs No other significant GI red flags  (no night-sweats, refractory abdominal pain n/v, hematemesis or melena). But she does have some mild to moderate unintentional weight loss but has had very significant reduced PO intake appetite with pain and nausea Abdomen is benign today, no acute abdomen on exam  Plan: 1. Start prolonged PPI trial with Omeprazole 40mg  daily for up to 8 weeks likely 3 months approx if possible PUD ulcer 2. Today  check Lab tests - [facility] CM[facility] STAT at [facility] - and also check [facility] - H pylori breath test (given in office), confirmed off PPI >2 weeks now - if positive, will increase PPI to BID dosing, add on triple therapy antibiotics with amoxicillin (1g BID) and clarithromycin (500mg  BID) for 2 weeks 3. Start carafate 1g TID WC + QHS PRN for symptom relief - Rx Zofran ODT PRN 4. Strict return criteria given for worsening symptoms 5. Follow-up within 4 weeks, if no improvement sooner, will refer to GI for further evaluation, may need future EGD given chronicity of problem. If gradual improvement can f/u in office within 4 weeks  If symptoms change or new concerns, similar to prior diverticulitis we can change therapy option.  Scanned prior CT imaging from 2017 and Colonoscopy from 2015.   Meds ordered this encounter  Medications  . sucralfate (CARAFATE) 1 g tablet    Sig: Take 1 tablet (1 g total) by mouth 4 (four) times daily -  with meals and at bedtime. As needed.    Dispense:  40 tablet    Refill:  1  . ondansetron (ZOFRAN ODT) 4 MG disintegrating tablet    Sig: Take 1 tablet (4 mg total) by mouth every 8 (eight) hours as needed for nausea or vomiting.    Dispense:  30 tablet    Refill:  0  . omeprazole (PRILOSEC) 40 MG capsule    Sig: Take 1 capsule (40 mg total) by mouth daily. Before first meal of day.    Dispense:  30 capsule    Refill:  2      Follow up plan: Return in about 4 weeks (around 01/08/2020), or if symptoms worsen or fail to improve, for abdominal pain,  GERD.    Nobie Putnam, [facility] 12/11/2019, 11:07 AM ------------------------------------  Follow-up after visit - 12/11/19 6:00pm  Called patient, reviewed STAT labs. Negative Lipase and WBC, LFTs. She had H pylori breath test done at [place] as well, pending result. Reviewed mild elevated Creatinine 1.19 - likely mild dehydration - goal to improve hydration and nutrition, see note A&P above with plan, no changes, start medicine as advised, follow-up if not improving.  Nobie Putnam, [facility] 12/11/2019, 6:00 PM

## 2019-12-12 LAB — H. PYLORI BREATH TEST: H pylori Breath Test: NEGATIVE

## 2019-12-18 DIAGNOSIS — D2230 Melanocytic nevi of unspecified part of face: Secondary | ICD-10-CM | POA: Diagnosis not present

## 2019-12-18 DIAGNOSIS — L820 Inflamed seborrheic keratosis: Secondary | ICD-10-CM | POA: Diagnosis not present

## 2019-12-18 DIAGNOSIS — L578 Other skin changes due to chronic exposure to nonionizing radiation: Secondary | ICD-10-CM | POA: Diagnosis not present

## 2019-12-18 DIAGNOSIS — Z1283 Encounter for screening for malignant neoplasm of skin: Secondary | ICD-10-CM | POA: Diagnosis not present

## 2019-12-18 DIAGNOSIS — L814 Other melanin hyperpigmentation: Secondary | ICD-10-CM | POA: Diagnosis not present

## 2019-12-18 DIAGNOSIS — D229 Melanocytic nevi, unspecified: Secondary | ICD-10-CM | POA: Diagnosis not present

## 2019-12-18 DIAGNOSIS — L821 Other seborrheic keratosis: Secondary | ICD-10-CM | POA: Diagnosis not present

## 2019-12-18 DIAGNOSIS — D692 Other nonthrombocytopenic purpura: Secondary | ICD-10-CM | POA: Diagnosis not present

## 2019-12-18 DIAGNOSIS — D1800 Hemangioma unspecified site: Secondary | ICD-10-CM | POA: Diagnosis not present

## 2019-12-18 DIAGNOSIS — D225 Melanocytic nevi of trunk: Secondary | ICD-10-CM | POA: Diagnosis not present

## 2019-12-28 DIAGNOSIS — M7062 Trochanteric bursitis, left hip: Secondary | ICD-10-CM | POA: Diagnosis not present

## 2019-12-29 DIAGNOSIS — K219 Gastro-esophageal reflux disease without esophagitis: Secondary | ICD-10-CM

## 2019-12-29 MED ORDER — SUCRALFATE 1 GM/10ML PO SUSP: 1.0000 g | Freq: Three times a day (TID) | ORAL | 0 refills | Status: DC

## 2019-12-30 ENCOUNTER — Other Ambulatory Visit: Payer: Self-pay | Admitting: Family Medicine

## 2019-12-30 DIAGNOSIS — R1013 Epigastric pain: Secondary | ICD-10-CM

## 2019-12-30 DIAGNOSIS — K219 Gastro-esophageal reflux disease without esophagitis: Secondary | ICD-10-CM

## 2020-01-04 ENCOUNTER — Other Ambulatory Visit: Payer: Self-pay | Admitting: Nurse Practitioner

## 2020-01-04 DIAGNOSIS — E782 Mixed hyperlipidemia: Secondary | ICD-10-CM

## 2020-01-16 ENCOUNTER — Other Ambulatory Visit: Payer: Self-pay | Admitting: Family Medicine

## 2020-01-16 DIAGNOSIS — K219 Gastro-esophageal reflux disease without esophagitis: Secondary | ICD-10-CM

## 2020-01-16 DIAGNOSIS — R1013 Epigastric pain: Secondary | ICD-10-CM

## 2020-01-29 DIAGNOSIS — M7062 Trochanteric bursitis, left hip: Secondary | ICD-10-CM | POA: Diagnosis not present

## 2020-02-05 DIAGNOSIS — M7062 Trochanteric bursitis, left hip: Secondary | ICD-10-CM | POA: Diagnosis not present

## 2020-02-06 DIAGNOSIS — S73102A Unspecified sprain of left hip, initial encounter: Secondary | ICD-10-CM | POA: Diagnosis not present

## 2020-02-06 DIAGNOSIS — M7062 Trochanteric bursitis, left hip: Secondary | ICD-10-CM | POA: Diagnosis not present

## 2020-02-09 DIAGNOSIS — M25552 Pain in left hip: Secondary | ICD-10-CM | POA: Diagnosis not present

## 2020-02-09 DIAGNOSIS — M7072 Other bursitis of hip, left hip: Secondary | ICD-10-CM | POA: Diagnosis not present

## 2020-02-12 ENCOUNTER — Ambulatory Visit (INDEPENDENT_AMBULATORY_CARE_PROVIDER_SITE_OTHER): Payer: Medicare HMO | Admitting: Family Medicine

## 2020-02-12 ENCOUNTER — Other Ambulatory Visit: Payer: Self-pay

## 2020-02-12 ENCOUNTER — Other Ambulatory Visit: Payer: Self-pay | Admitting: Family Medicine

## 2020-02-12 ENCOUNTER — Encounter: Payer: Self-pay | Admitting: Family Medicine

## 2020-02-12 VITALS — BP 140/63 | HR 75 | Temp 97.7°F | Resp 16 | Ht 63.5 in | Wt 143.0 lb

## 2020-02-12 DIAGNOSIS — E782 Mixed hyperlipidemia: Secondary | ICD-10-CM

## 2020-02-12 DIAGNOSIS — K219 Gastro-esophageal reflux disease without esophagitis: Secondary | ICD-10-CM | POA: Diagnosis not present

## 2020-02-12 DIAGNOSIS — N1831 Chronic kidney disease, stage 3a: Secondary | ICD-10-CM

## 2020-02-12 DIAGNOSIS — N183 Chronic kidney disease, stage 3 unspecified: Secondary | ICD-10-CM

## 2020-02-12 DIAGNOSIS — I129 Hypertensive chronic kidney disease with stage 1 through stage 4 chronic kidney disease, or unspecified chronic kidney disease: Secondary | ICD-10-CM

## 2020-02-12 DIAGNOSIS — R634 Abnormal weight loss: Secondary | ICD-10-CM

## 2020-02-12 DIAGNOSIS — R7309 Other abnormal glucose: Secondary | ICD-10-CM

## 2020-02-12 DIAGNOSIS — Z0000 Encounter for general adult medical examination without abnormal findings: Secondary | ICD-10-CM

## 2020-02-12 NOTE — Progress Notes (Signed)
Subjective:    Patient ID: Robin Lang, female    DOB: 05-Jul-1951, 69 y.o.   MRN: [id number]  Robin Lang is a 69 y.o. female presenting on 02/12/2020 for Gastroesophageal Reflux (follow up)   HPI   Follow-up GERD / Weight Loss - Last visit with me 12/11/19, for initial visit for same problem, treated with PPI, Sucralfate, Zofran, checked lab work up including H pylori breath test, see prior notes for background information. - Interval update with resolved GERD symptoms after several weeks, completed PPI therapy then off med after 4-6 weeks approximately. - Today patient reports doing well overall, she is asymptomatic from GERD now, no longer on PPI or carafate. - Now describes issue with weight loss over past 1 year approximately. Overall down about 30 lbs in >1 year. But more recent trend is about 15 lb weight loss in 4-6 months. She attributes some of the initial weight loss to poor appetite not eating with GERD and abdominal pain, but now that has resolved and she is eating better actually healthier diet without problem, she is calorie monitoring and limiting excess calories, so some weight loss is intentional. Also she admits due to L hip pain problem she is losing some muscle mass not able to exercise, and feels this difference as well Recent trend Weight down 3 lbs in about 2 months Dietary - she was having very limited eating PO intake prior to visit with me in 12/11/19 but then after PPI therapy she has done very well and GERD is resolved and she now has been eating better over past 4-6 weeks now that GERD is treated and she is very aware now of what she is eating and healthier options and some smaller portions. Denies any dark stools, blood in stool, diarrhea, constipation, fever chills, abdominal pain, nausea vomiting acid reflux or heartburn  Additionally L hip chronic pain Followed by orthopedics   Depression screen [facility] 2/9 12/11/2019 08/23/2019 07/27/2018  Decreased Interest 0  0 0  Down, Depressed, Hopeless 0 0 0  PHQ - 2 Score 0 0 0    Social History   Tobacco Use  . Smoking status: Former Smoker    Packs/day: 0.50    Years: 9.00    Pack years: 4.50    Quit date: 1989    Years since quitting: 32.2  . Smokeless tobacco: Never Used  Substance Use Topics  . Alcohol use: Yes    Alcohol/week: 2.0 standard drinks    Types: 2 Glasses of wine per week    Comment: occ  . Drug use: Never    Review of Systems Per HPI unless specifically indicated above     Objective:    BP 140/63   Pulse 75   Temp 97.7 F (36.5 C) (Temporal)   Resp 16   Ht 5' 3.5" (1.613 m)   Wt 143 lb (64.9 kg)   BMI 24.93 kg/m   Wt Readings from Last 3 Encounters:  02/12/20 143 lb (64.9 kg)  12/11/19 146 lb (66.2 kg)  08/23/19 159 lb 6.4 oz (72.3 kg)    Physical Exam Vitals and nursing note reviewed.  Constitutional:      General: She is not in acute distress.    Appearance: She is well-developed. She is not diaphoretic.     Comments: Well-appearing, comfortable, cooperative  HENT:     Head: Normocephalic and atraumatic.  Eyes:     General:        Right eye: No discharge.  Left eye: No discharge.     Conjunctiva/sclera: Conjunctivae normal.  Cardiovascular:     Rate and Rhythm: Normal rate.  Pulmonary:     Effort: Pulmonary effort is normal.  Skin:    General: Skin is warm and dry.     Findings: No erythema or rash.  Neurological:     Mental Status: She is alert and oriented to person, place, and time.  Psychiatric:        Behavior: Behavior normal.     Comments: Well groomed, good eye contact, normal speech and thoughts        Results for orders placed or performed in visit on 12/11/19  CBC with Differential/Platelet  Result Value Ref Range   WBC 7.6 3.4 - 10.8 x10E3/uL   RBC 4.26 3.77 - 5.28 x10E6/uL   Hemoglobin 13.3 11.1 - 15.9 g/dL   Hematocrit 39.0 34.0 - 46.6 %   MCV 92 79 - 97 fL   MCH 31.2 26.6 - 33.0 pg   MCHC 34.1 31.5 - 35.7 g/dL     RDW 12.1 11.7 - 15.4 %   Platelets 237 150 - 450 x10E3/uL   Neutrophils 77 Not Estab. %   Lymphs 14 Not Estab. %   Monocytes 9 Not Estab. %   Eos 0 Not Estab. %   Basos 0 Not Estab. %   Neutrophils Absolute 5.8 1.4 - 7.0 x10E3/uL   Lymphocytes Absolute 1.1 0.7 - 3.1 x10E3/uL   Monocytes Absolute 0.7 0.1 - 0.9 x10E3/uL   EOS (ABSOLUTE) 0.0 0.0 - 0.4 x10E3/uL   Basophils Absolute 0.0 0.0 - 0.2 x10E3/uL   Immature Granulocytes 0 Not Estab. %   Immature Grans (Abs) 0.0 0.0 - 0.1 x10E3/uL  H. pylori breath test  Result Value Ref Range   H pylori Breath Test Negative Negative  Lipase  Result Value Ref Range   Lipase 58 14 - 72 U/L  Comprehensive metabolic panel  Result Value Ref Range   Glucose 101 (H) 65 - 99 mg/dL   BUN 20 8 - 27 mg/dL   Creatinine, Ser 1.19 (H) 0.57 - 1.00 mg/dL   GFR calc non Af Amer 47 (L) >59 mL/min/1.73   GFR calc Af Amer 54 (L) >59 mL/min/1.73   BUN/Creatinine Ratio 17 12 - 28   Sodium 143 134 - 144 mmol/L   Potassium 3.9 3.5 - 5.2 mmol/L   Chloride 106 96 - 106 mmol/L   CO2 23 20 - 29 mmol/L   Calcium 10.5 (H) 8.7 - 10.3 mg/dL   Total Protein 6.4 6.0 - 8.5 g/dL   Albumin 4.0 3.8 - 4.8 g/dL   Globulin, Total 2.4 1.5 - 4.5 g/dL   Albumin/Globulin Ratio 1.7 1.2 - 2.2   Bilirubin Total 0.4 0.0 - 1.2 mg/dL   Alkaline Phosphatase 63 39 - 117 IU/L   AST 15 0 - 40 IU/L   ALT 18 0 - 32 IU/L  HM COLONOSCOPY  Result Value Ref Range   HM Colonoscopy See Report (in chart) See Report (in chart), Patient Reported      Assessment & Plan:   Problem List Items Addressed This Visit    None    Visit Diagnoses    Gastroesophageal reflux disease, unspecified whether esophagitis present    -  Primary   Weight loss          GERD - Resolved Acute abdominal pain GERD vs PUD resolved on acute high dose PPI course in past 4-6 weeks, also had  work up negative h pylori. Now off of PPI therapy Controlled now on diet and lifestyle Encourage remain off PPI, can  continue lifestyle Future follow-up if recurrence  Weight Loss Gradual over 1 year, recent trend in 5-6 months seems down about 15 lbs, likely multifactorial both reduced diet due to GERD GI issue, now tolerating PO intake and improved healthy diet and limiting calories as well. - Also consider loss of muscle mass w limited exercise lately factor as well. - Encourage improve balance nutrition PO intake and protein supplement  Future labs in 6 months approx, f/u weight trend, expect that it may be stabilizing or plateau  No orders of the defined types were placed in this encounter.     Follow up plan: Return in about 6 months (around 08/14/2020) for Annual Physical.  Future labs ordered for 08/2020  Nobie Putnam, [facility] 02/12/2020, 2:25 PM

## 2020-02-12 NOTE — Patient Instructions (Addendum)
Thank you for coming to the office today.  Keep up the good work Glad the acid reflux is resolved Goal to maintain weight Now seems stabilized or almost plateau - hopefully if muscle mass regains some can maintain and gain even if need.  DUE for FASTING BLOOD WORK (no food or drink after midnight before the lab appointment, only water or coffee without cream/sugar on the morning of)  SCHEDULE "Lab Only" visit in the morning at the clinic for lab draw in 6 MONTHS   - Make sure Lab Only appointment is at about 1 week before your next appointment, so that results will be available  For Lab Results, once available within 2-3 days of blood draw, you can can log in to MyChart online to view your results and a brief explanation. Also, we can discuss results at next follow-up visit.   Please schedule a Follow-up Appointment to: Return in about 6 months (around 08/14/2020) for Annual Physical.  If you have any other questions or concerns, please feel free to call the office or send a message through [place]. You may also schedule an earlier appointment if necessary.  Additionally, you may be receiving a survey about your experience at our office within a few days to 1 week by e-mail or mail. We value your feedback.  Nobie Putnam, DO [place]

## 2020-02-20 DIAGNOSIS — M25552 Pain in left hip: Secondary | ICD-10-CM | POA: Diagnosis not present

## 2020-02-27 ENCOUNTER — Ambulatory Visit (INDEPENDENT_AMBULATORY_CARE_PROVIDER_SITE_OTHER): Payer: Medicare HMO | Admitting: Family Medicine

## 2020-02-27 ENCOUNTER — Ambulatory Visit
Admission: RE | Admit: 2020-02-27 | Discharge: 2020-02-27 | Disposition: A | Discharge status: Home / Self Care | Payer: Medicare HMO | Source: Ambulatory Visit | Attending: Family Medicine | Admitting: Family Medicine

## 2020-02-27 ENCOUNTER — Encounter: Payer: Self-pay | Admitting: Family Medicine

## 2020-02-27 ENCOUNTER — Other Ambulatory Visit: Payer: Self-pay

## 2020-02-27 VITALS — BP 138/57 | HR 77 | Temp 98.6°F | Ht 63.5 in | Wt 147.6 lb

## 2020-02-27 DIAGNOSIS — M545 Low back pain: Secondary | ICD-10-CM | POA: Diagnosis not present

## 2020-02-27 DIAGNOSIS — M5442 Lumbago with sciatica, left side: Secondary | ICD-10-CM | POA: Diagnosis not present

## 2020-02-27 DIAGNOSIS — M4726 Other spondylosis with radiculopathy, lumbar region: Secondary | ICD-10-CM | POA: Diagnosis not present

## 2020-02-27 DIAGNOSIS — G8929 Other chronic pain: Secondary | ICD-10-CM

## 2020-02-27 DIAGNOSIS — M25552 Pain in left hip: Secondary | ICD-10-CM

## 2020-02-27 MED ORDER — BACLOFEN 10 MG PO TABS: 5.0000 mg | ORAL_TABLET | Freq: Three times a day (TID) | ORAL | 2 refills | Status: DC | PRN

## 2020-02-27 MED ORDER — PREDNISONE 20 MG PO TABS: ORAL_TABLET | ORAL | 0 refills | Status: DC

## 2020-02-27 MED ORDER — CELECOXIB 100 MG PO CAPS: 100.0000 mg | ORAL_CAPSULE | Freq: Two times a day (BID) | ORAL | 2 refills | Status: DC | PRN

## 2020-02-27 NOTE — Progress Notes (Addendum)
Subjective:    Patient ID: Robin Lang, female    DOB: [birth year], 69 y.o.   MRN: [id number]  Robin Lang is a 69 y.o. female presenting on 02/27/2020 for Hip Pain (Left hip and knee pain. x1 year )   HPI   Chronic Low Back Pain / Left Hip Pain / Osteoarthritis DDD with Sciatica / Radiculopathy - Prior history of bilateral knee surgeries from meniscus tears, history of baker's cyst back of R knee (s/p drainage episodic), he would also receive cortisone steroid injections bilateral knees periodically - Followed by [facility] - Dr Robin Lang and eventually referred to Dr Robin Lang - Now new problem onset about 1 year ago (12/2018) with Left hip lateral pain that radiated down on outside of left leg into lower extremity, diagnosed with Trochanteric Bursitis, treated with steroid injections. - Worsening chronic pain. She is concerned about inflammation and swelling of joints. Her pain in Left lower extremity and back is debilitating and impacting her ADLs, difficulty prolong standing, walking, unable to do normal activities - Treatment of Prednisone oral steroid temporarily with some relief. - In past was on Meloxicam NSAID in past and was discontinued in past due to GERD and PUD, which has been treated and now resolved. She also tried Gabapentin in past 300mg  without relief. Was on Tramadol PRN limited relief. - She wears an ice pack constantly and often is rubbing side of her left leg - In March 2021, she was referred to [facility] and did evaluation but was declined by PT due to severity of her pain, unable to proceed - Only time not feeling pain is when asleep, often will have pillow between legs. - Prior history 20+ years ago slipped and fell and tore Left hamstring tendon, this was reportedly healed on its own without surgery. She had MRI 01/30/20 that showed some hamstring tendonpathy see below - Ortho unable to solve issue and therefore referred to Rheumatology Robin Lang in  02/2020 has not been yet Admits weakness due to pain Denies any trauma fall injury, redness, numbness tingling    Depression screen [facility] 2/9 12/11/2019 08/23/2019 07/27/2018  Decreased Interest 0 0 0  Down, Depressed, Hopeless 0 0 0  PHQ - 2 Score 0 0 0    Social History   Tobacco Use  . Smoking status: Former Smoker    Packs/day: 0.50    Years: 9.00    Pack years: 4.50    Quit date: 1989    Years since quitting: 32.3  . Smokeless tobacco: Never Used  Substance Use Topics  . Alcohol use: Yes    Alcohol/week: 2.0 standard drinks    Types: 2 Glasses of wine per week    Comment: occ  . Drug use: Never    Review of Systems Per HPI unless specifically indicated above     Objective:    BP (!) 138/57   Pulse 77   Temp 98.6 F (37 C) (Temporal)   Ht 5' 3.5" (1.613 m)   Wt 147 lb 9.6 oz (67 kg)   SpO2 100%   BMI 25.74 kg/m   Wt Readings from Last 3 Encounters:  02/27/20 147 lb 9.6 oz (67 kg)  02/12/20 143 lb (64.9 kg)  12/11/19 146 lb (66.2 kg)    Physical Exam Vitals and nursing note reviewed.  Constitutional:      General: She is not in acute distress.    Appearance: She is well-developed. She is not diaphoretic.  Comments: Well-appearing, comfortable, cooperative  HENT:     Head: Normocephalic and atraumatic.  Eyes:     General:        Right eye: No discharge.        Left eye: No discharge.     Conjunctiva/sclera: Conjunctivae normal.  Cardiovascular:     Rate and Rhythm: Normal rate.  Pulmonary:     Effort: Pulmonary effort is normal.  Musculoskeletal:     Comments: Low Back / L hip and Greater Trochanter Inspection: BACK - Normal appearance, no spinal deformity, symmetrical. HIP - Normal appearance, symmetrical, no obvious leg length or pelvis deformity  Palpation: Left Knee with some increased effusion it appears, no active joint line tenderness.  BACK - No tenderness over spinous processes. Bilateral lumbar paraspinal muscles non-tender but with  some  hypertonicity/spasm. HIP - Mild to moderate tender to palpation deeper L greater trochanter region of lateral upper thigh. Lower extremity thigh calf soft non tender no spasm.  ROM: BACK - Full active ROM forward flex / back extension, rotation L/R without discomfort HIP - Bilateral hip flex/ext supine normal, internal and external rotation normal without problem or limitation.  Special Testing: BACK - Seated SLR positive for radicular pain and localized L hip pain  HIP - Intact left hip range of motion testing, limited exam due to severity of pain.  Strength: Bilateral hip flex/ext 5/5, knee flex/ext 5/5, ankle dorsiflex/plantarflex 5/5 Neurovascular: intact distal sensation to light touch   Skin:    General: Skin is warm and dry.     Findings: No erythema or rash.  Neurological:     Mental Status: She is alert and oriented to person, place, and time.  Psychiatric:        Behavior: Behavior normal.     Comments: Well groomed, good eye contact, normal speech and thoughts     I have personally reviewed the radiology report from MR L Hip on 01/30/20.  MR LEFT HIP W/O CONTRAST  CONCLUSIONS 1. Hamstring tendon origin tendinopathy, peritendinitis and partial-thickness interstitial teneosseous origin tearing. Up to 20% of the origin is torn. 2. Nondisplaced anterosuperior and superolateral labral tear. 3. No fracture, AVN or mass. 4. Mild tendinopathy and peritendinitis involves the gluteus minimus and medius insertion.  Results for orders placed or performed in visit on 12/11/19  CBC with Differential/Platelet  Result Value Ref Range   WBC 7.6 3.4 - 10.8 x10E3/uL   RBC 4.26 3.77 - 5.28 x10E6/uL   Hemoglobin 13.3 11.1 - 15.9 g/dL   Hematocrit 39.0 34.0 - 46.6 %   MCV 92 79 - 97 fL   MCH 31.2 26.6 - 33.0 pg   MCHC 34.1 31.5 - 35.7 g/dL   RDW 12.1 11.7 - 15.4 %   Platelets 237 150 - 450 x10E3/uL   Neutrophils 77 Not Estab. %   Lymphs 14 Not Estab. %   Monocytes 9 Not  Estab. %   Eos 0 Not Estab. %   Basos 0 Not Estab. %   Neutrophils Absolute 5.8 1.4 - 7.0 x10E3/uL   Lymphocytes Absolute 1.1 0.7 - 3.1 x10E3/uL   Monocytes Absolute 0.7 0.1 - 0.9 x10E3/uL   EOS (ABSOLUTE) 0.0 0.0 - 0.4 x10E3/uL   Basophils Absolute 0.0 0.0 - 0.2 x10E3/uL   Immature Granulocytes 0 Not Estab. %   Immature Grans (Abs) 0.0 0.0 - 0.1 x10E3/uL  H. pylori breath test  Result Value Ref Range   H pylori Breath Test Negative Negative  Lipase  Result  Value Ref Range   Lipase 58 14 - 72 U/L  Comprehensive metabolic panel  Result Value Ref Range   Glucose 101 (H) 65 - 99 mg/dL   BUN 20 8 - 27 mg/dL   Creatinine, Ser 1.19 (H) 0.57 - 1.00 mg/dL   GFR calc non Af Amer 47 (L) >59 mL/min/1.73   GFR calc Af Amer 54 (L) >59 mL/min/1.73   BUN/Creatinine Ratio 17 12 - 28   Sodium 143 134 - 144 mmol/L   Potassium 3.9 3.5 - 5.2 mmol/L   Chloride 106 96 - 106 mmol/L   CO2 23 20 - 29 mmol/L   Calcium 10.5 (H) 8.7 - 10.3 mg/dL   Total Protein 6.4 6.0 - 8.5 g/dL   Albumin 4.0 3.8 - 4.8 g/dL   Globulin, Total 2.4 1.5 - 4.5 g/dL   Albumin/Globulin Ratio 1.7 1.2 - 2.2   Bilirubin Total 0.4 0.0 - 1.2 mg/dL   Alkaline Phosphatase 63 39 - 117 IU/L   AST 15 0 - 40 IU/L   ALT 18 0 - 32 IU/L  HM COLONOSCOPY  Result Value Ref Range   HM Colonoscopy See Report (in chart) See Report (in chart), Patient Reported      Assessment & Plan:   Problem List Items Addressed This Visit    Osteoarthritis of spine with radiculopathy, lumbar region - Primary   Relevant Medications   celecoxib (CELEBREX) 100 MG capsule   predniSONE (DELTASONE) 20 MG tablet   Other Relevant Orders   DG Lumbar Spine Complete   Chronic left-sided low back pain with left-sided sciatica   Relevant Medications   celecoxib (CELEBREX) 100 MG capsule   predniSONE (DELTASONE) 20 MG tablet   Other Relevant Orders   DG Lumbar Spine Complete   Chronic left hip pain   Relevant Medications   celecoxib (CELEBREX) 100 MG  capsule   predniSONE (DELTASONE) 20 MG tablet      Subacute on chronic L LBP / Hip pain with associated L sciatica Known Lumbar DDD, suspected nerve impingement symptoms No obvious L hip DJD. But previously identified Left lower extremity hamstring / peritendinitis and partial-thickness tear up to 20%, non displaced anterosuperior and superolateral labral tear, some mild tendinopathy of gluteus muscles as well.  Likely multifactorial pain. But concern now is for Lumbar Spine with neuropathic pain into Left lower extremity, as her exam and history is not suggestive of classic Left hip OA/DJD - no radiating groin pain, and has range of motion, but seems limited primarily by pain. Prior X-ray and MRI left hip does not show significant joint space or acetabular problem.  Failed most therapy - Tylenol, Meloxicam, Naproxen, Diclofenac, Voltaren topical, Gabapentin, Tramadol - Steroid injections trochanteric bursa, Left knee - Physical therapy 01/2020, unable to proceed due to pain  Last diagnostic with MRI L hip 01/30/20, reviewed, copied above.   Plan: 1. Discussion on other diagnostic possibilities today - given she has completed extensive work up with Orthopedics and ultimately no clearly defined diagnosis or successful treatment of her Left knee / Hip so far. - Will pursue further possibility of Lumbar Spine, seems to have Dermatomal pattern of pain in L4-L5 region. Seems mostly inflammatory or neuropathic pain at this time.  Start Lumbar spine X-ray today in office. Last x-ray was 12/2018.  Start Prednisone taper over 12 days  HOLD until finished Prednisone then Start anti-inflammatory trial with rx Celebrex 100mg  BID, given history of PUD - Will advise to use existing Omeprazole 40mg  daily,  and add Carafate PRN dosing NSAID if need - Tylenol 1g TID PRN - Use topical ice / heat - Activity modification  Follow-up within 2-4 weeks, pending response to treatment and x-ray. Will plan to  curbside / consult with Sports Medicine as next option, can contact Dr Micheline Chapman ([facility] in [place]) to see if he can offer additional diagnostic assistance with this patient, and then in future may end up working towards a Lumbar MRI if can get approved, and may pursue Spine/neurosurgery in future if ultimately diagnosis is most consistent with radiculopathy.   Meds ordered this encounter  Medications  . celecoxib (CELEBREX) 100 MG capsule    Sig: Take 1 capsule (100 mg total) by mouth 2 (two) times daily as needed for moderate pain.    Dispense:  60 capsule    Refill:  2  . predniSONE (DELTASONE) 20 MG tablet    Sig: Take 2 tablets daily (40mg ) for 4 days, take 1 tab daily (20mg ) for 4 days, take half tab daily (10mg ) for 4 days    Dispense:  14 tablet    Refill:  0   Orders Placed This Encounter  Procedures  . DG Lumbar Spine Complete    Standing Status:   Future    Number of Occurrences:   1    Standing Expiration Date:   04/28/2021    Order Specific Question:   Reason for Exam (SYMPTOM  OR DIAGNOSIS REQUIRED)    Answer:   chronic left low back pain and left hip pain, had prior x-ray arthritis DDD 12/2018, worse neuropathic pain    Order Specific Question:   Preferred imaging location?    Answer:   [facility]    Order Specific Question:   Radiology Contrast Protocol - do NOT remove file path    Answer:   [email or web address]     Follow up plan: Return in about 4 weeks (around 03/26/2020), or if symptoms worsen or fail to improve, for 2-4 weeks back pain.    Nobie Putnam, [facility] 02/27/2020, 8:54 AM

## 2020-02-27 NOTE — Patient Instructions (Addendum)
Thank you for coming to the office today.  After X-ray reviewed, will contact and refer you to Dr Micheline Chapman if he agrees.  [facility]  .[facility] Address: [address], [place], [place] [address] Phone: [phone]  1. For your Back Pain - I think that this is due to Muscle Spasms or strain. Your Sciatic Nerve can be affected causing some of your radiation and numbness down your legs. 2. Start with Prednisone taper over 12 days, may finish it or skip last 2-3 doses if you prefer. - Switch to Celebrex AFTER prednisone if we can get it approved - Try to take Omeprazole 40mg  and Carafate with each dose if possible to lessen stomach issues 3. CONSIDER ADD MUSCLE RELAXANT - Call if need either Baclofen (Lioresal) / Cyclobenzapine (Flexeril) 10mg  tablets - cut in half for 5mg  at night for muscle relaxant - may make you sedated or sleepy (be careful driving or working on this) if tolerated you can take every 8 hours, half or whole tab 4. May use Tylenol Extra Str 500mg  tabs - may take 2 tablets every 6-8 hours as needed for 1000mg  up to 3 times a day 5. Recommend to start using ice / heating pad on your lower back 1-2x daily for few weeks  This pain may take weeks to months to fully resolve, but hopefully it will respond to the medicine initially. All back injuries (small or serious) are slow to heal since we use our back muscles every day. Be careful with turning, twisting, lifting, sitting / standing for prolonged periods, and avoid re-injury.  If your symptoms significantly worsen with more pain, or new symptoms with weakness in one or both legs, new or different shooting leg pains, numbness in legs or groin, loss of control or retention of urine or bowel movements, please call back for advice and you may need to go directly to the Emergency Department.   Please schedule a Follow-up Appointment to: Return in about 4 weeks (around 03/26/2020), or if  symptoms worsen or fail to improve, for 2-4 weeks back pain.  If you have any other questions or concerns, please feel free to call the office or send a message through [facility]. You may also schedule an earlier appointment if necessary.  Additionally, you may be receiving a survey about your experience at our office within a few days to 1 week by e-mail or mail. We value your feedback.  Nobie Putnam, DO [facility]             Low Back Pain Exercises  See other page with pictures of each exercise.  Start with 1 or 2 of these exercises that you are most comfortable with. Do not do any exercises that cause you significant worsening pain. Some of these may cause some "stretching soreness" but it should go away after you stop the exercise, and get better over time. Gradually increase up to 3-4 exercises as tolerated.  Standing hamstring stretch: Place the heel of your leg on a stool about 15 inches high. Keep your knee straight. Lean forward, bending at the hips until you feel a mild stretch in the back of your thigh. Make sure you do not roll your shoulders and bend at the waist when doing this or you will stretch your lower back instead. Hold the stretch for 15 to 30 seconds. Repeat 3 times. Repeat the same stretch on your other leg.  Cat and  camel: Get down on your hands and knees. Let your stomach sag, allowing your back to curve downward. Hold this position for 5 seconds. Then arch your back and hold for 5 seconds. Do 3 sets of 10.  Quadriped Arm/Leg Raises: Get down on your hands and knees. Tighten your abdominal muscles to stiffen your spine. While keeping your abdominals tight, raise one arm and the opposite leg away from you. Hold this position for 5 seconds. Lower your arm and leg slowly and alternate sides. Do this 10 times on each side.  Pelvic tilt: Lie on your back with your knees bent and your feet flat on the floor. Tighten your abdominal  muscles and push your lower back into the floor. Hold this position for 5 seconds, then relax. Do 3 sets of 10.  Partial curl: Lie on your back with your knees bent and your feet flat on the floor. Tighten your stomach muscles and flatten your back against the floor. Tuck your chin to your chest. With your hands stretched out in front of you, curl your upper body forward until your shoulders clear the floor. Hold this position for 3 seconds. Don't hold your breath. It helps to breathe out as you lift your shoulders up. Relax. Repeat 10 times. Build to 3 sets of 10. To challenge yourself, clasp your hands behind your head and keep your elbows out to the side.  Lower trunk rotation: Lie on your back with your knees bent and your feet flat on the floor. Tighten your abdominal muscles and push your lower back into the floor. Keeping your shoulders down flat, gently rotate your legs to one side, then the other as far as you can. Repeat 10 to 20 times.  Single knee to chest stretch: Lie on your back with your legs straight out in front of you. Bring one knee up to your chest and grasp the back of your thigh. Pull your knee toward your chest, stretching your buttock muscle. Hold this position for 15 to 30 seconds and return to the starting position. Repeat 3 times on each side.  Double knee to chest: Lie on your back with your knees bent and your feet flat on the floor. Tighten your abdominal muscles and push your lower back into the floor. Pull both knees up to your chest. Hold for 5 seconds and repeat 10 to 20 times.

## 2020-03-07 ENCOUNTER — Other Ambulatory Visit: Payer: Self-pay | Admitting: Nurse Practitioner

## 2020-03-07 ENCOUNTER — Ambulatory Visit: Payer: Medicare HMO | Admitting: Sports Medicine

## 2020-03-07 ENCOUNTER — Other Ambulatory Visit: Payer: Self-pay

## 2020-03-07 ENCOUNTER — Other Ambulatory Visit: Payer: Self-pay | Admitting: Family Medicine

## 2020-03-07 VITALS — BP 140/80 | Ht 63.5 in | Wt 146.0 lb

## 2020-03-07 DIAGNOSIS — M5442 Lumbago with sciatica, left side: Secondary | ICD-10-CM | POA: Diagnosis not present

## 2020-03-07 DIAGNOSIS — G8929 Other chronic pain: Secondary | ICD-10-CM | POA: Diagnosis not present

## 2020-03-07 DIAGNOSIS — I10 Essential (primary) hypertension: Secondary | ICD-10-CM

## 2020-03-07 DIAGNOSIS — M2550 Pain in unspecified joint: Secondary | ICD-10-CM

## 2020-03-07 NOTE — Telephone Encounter (Signed)
Requested medication (s) are due for refill today: Yes  Requested medication (s) are on the active medication list: Yes  Last refill:  03/07/20  Future visit scheduled: Yes  Notes to clinic:  Historical provider.    Requested Prescriptions  Pending Prescriptions Disp Refills   sucralfate (CARAFATE) 1 g tablet [Pharmacy Med Name: Sucralfate 1 GM Oral Tablet] 40 tablet 0    Sig: TAKE 1 TABLET BY MOUTH 4 TIMES DAILY WITH MEALS AND AT BEDTIME AS NEEDED      Gastroenterology: Antiacids Passed - 03/07/2020  1:10 PM      Passed - Valid encounter within last 12 months    Recent Outpatient Visits           1 week ago Osteoarthritis of spine with radiculopathy, lumbar region   [place], DO   3 weeks ago Gastroesophageal reflux disease, unspecified whether esophagitis present   [place], DO   2 months ago Epigastric pain   [facility] Olin Hauser, DO   6 months ago Lipid screening   [facility] Merrilyn Puma, Jerrel Ivory, NP   8 months ago Foot mass, left   [place], Jerrel Ivory, NP       Future Appointments             In 5 months [place], Devonne Doughty, DO [facility]             Signed Prescriptions Disp Refills   triamterene-hydrochlorothiazide (MAXZIDE) 75-50 MG tablet 45 tablet 0    Sig: Take 1/2 (one-half) tablet by mouth once daily      Cardiovascular: Diuretic Combos Failed - 03/07/2020  1:10 PM      Failed - Cr in normal range and within 360 days    Creatinine, Ser  Date Value Ref Range Status  12/11/2019 1.19 (H) 0.57 - 1.00 mg/dL Final          Failed - Ca in normal range and within 360 days    Calcium  Date Value Ref Range Status  12/11/2019 10.5 (H) 8.7 - 10.3 mg/dL Final          Failed - Last BP in normal range    BP Readings from Last 1 Encounters:  03/07/20 140/80          Passed - K in normal range and within 360 days    Potassium  Date Value Ref Range Status  12/11/2019 3.9 3.5 - 5.2 mmol/L Final          Passed - Na in normal range and within 360 days    Sodium  Date Value Ref Range Status  12/11/2019 143 134 - 144 mmol/L Final          Passed - Valid encounter within last 6 months    Recent Outpatient Visits           1 week ago Osteoarthritis of spine with radiculopathy, lumbar region   [place], DO   3 weeks ago Gastroesophageal reflux disease, unspecified whether esophagitis present   [place], DO   2 months ago Epigastric pain   [facility] Olin Hauser, DO   6 months ago Lipid screening   [facility] Mikey College, NP   8 months ago Foot mass, left   [place]  [facility] Mikey College, NP       Future Appointments             In 5 months Parks Ranger, Devonne Doughty, DO [facility]              omeprazole (PRILOSEC) 40 MG capsule 90 capsule 0    Sig: TAKE 1 CAPSULE BY MOUTH ONCE DAILY BEFORE  FIRST  MEAL  OF  DAY      Gastroenterology: Proton Pump Inhibitors Passed - 03/07/2020  1:10 PM      Passed - Valid encounter within last 12 months    Recent Outpatient Visits           1 week ago Osteoarthritis of spine with radiculopathy, lumbar region   [place], DO   3 weeks ago Gastroesophageal reflux disease, unspecified whether esophagitis present   [place], DO   2 months ago Epigastric pain   [place], DO   6 months ago Lipid screening   [facility] Mikey College, NP   8 months ago Foot mass, left   [facility] Merrilyn Puma, Jerrel Ivory, NP       Future Appointments              In 5 months Parks Ranger, Devonne Doughty, [facility], [facility]

## 2020-03-07 NOTE — Patient Instructions (Signed)
Go to [facility] to get your blood work done. [place]. [address]. Bring the printed labs with you. Call [facility] to schedule your MRI. We will call you with these results once available.

## 2020-03-08 ENCOUNTER — Encounter: Payer: Self-pay | Admitting: Sports Medicine

## 2020-03-08 NOTE — Progress Notes (Addendum)
Subjective:    Patient ID: Robin Lang, female    DOB: 10-09-1951, 69 y.o.   MRN: [id number]  HPI chief complaint: Low back pain and left leg pain  Very pleasant 69 year old female comes in today with request of her primary care physician for evaluation.  Patient is complaining of left-sided low back pain and hip pain with radiating pain down the left leg.  She has had intermittent low back pain for years.  In fact, she had some sort of spinal injection done 15 to 20 years ago which helped tremendously.  Over the past year or so she has started to slowly have increasing pain.  Pain begins around the left hip and will radiate down the left leg.  Initially it was not going past the knee but today she endorses some pain and discomfort along the lateral aspect of her left foot.  She was seen by an orthopedist in [place].  An MRI of her left hip was ordered.  She was treated with physical therapy and hip injections which were not effective. Patient denies groin pain.  No recent trauma.  She does endorse some mild weakness in the left leg.  Standing and walking are more uncomfortable than sitting.  Pain is described as burning in quality.  No prior low back surgeries.  Past medical history reviewed Medications reviewed Allergies reviewed  Review of Systems As above    Objective:   Physical Exam  Well-developed, well-nourished.  No acute distress.  Awake alert and oriented x3.  Vital signs reviewed.  Examination of the lumbar spine shows no tenderness to palpation.  Fairly good lumbar range of motion.  Examination of the left hip show smooth painless hip range of motion with a negative logroll.  Slight tenderness to palpation along the posterior lateral aspect of the hip.  Neurological exam shows strength to be 5/5 in both lower extremities.  Reflexes are trace but equal at the patellar and Achilles tendons.  No atrophy.  Sensation intact to light touch grossly.  X-rays of her lumbar spine  including AP and lateral views shows severe disc space narrowing at L5-S1 as well as mild degenerative disc changes at L3-L4 and L4-L5.  There is also evidence of an old mild compression fracture at L4.  Nothing acute is seen.  MRI report of the left hip is available for review.  Study is dated January 30, 2020.  Report shows a degenerative labral tear as well as proximal hamstring tendinopathy and mild tendinopathy of the gluteus medius and minimus.      Assessment & Plan:   Chronic left hip and leg pain likely referred pain from lumbar radiculopathy  Clinical exam does not fit hip pathology.  X-ray of her lumbar spine shows multilevel degenerative changes, especially at L5-S1.  I would like to get an MRI of the lumbar spine to evaluate further in anticipation of ordering a diagnostic/therapeutic lumbar epidural steroid injection.  I would also like to check a sed rate, C-reactive protein, ANA, rheumatoid factor and anti-CCP antibody just to rule out rheumatologic causes for her pain.  Phone follow-up with all of these results when available and we will delineate further treatment based on those findings. Of note, I did discuss the fact that a compression fracture in her lumbar spine is a sign of osteoporosis and I have encouraged her to discuss work-up and treatment with her PCP.  Addendum: Blood work reviewed.  C-reactive protein slightly elevated but remainder of the blood work is  unremarkable.  No evidence of rheumatoid arthritis, lupus, or other inflammatory process.

## 2020-03-09 LAB — SEDIMENTATION RATE: Sed Rate: 2 mm/hr (ref 0–40)

## 2020-03-09 LAB — CYCLIC CITRUL PEPTIDE ANTIBODY, IGG/IGA: Cyclic Citrullin Peptide Ab: 6 units (ref 0–19)

## 2020-03-09 LAB — RHEUMATOID FACTOR: Rheumatoid fact SerPl-aCnc: <10 IU/mL (ref 0.0–13.9)

## 2020-03-09 LAB — ANA: Anti Nuclear Antibody (ANA): NEGATIVE

## 2020-03-09 LAB — C-REACTIVE PROTEIN: CRP: 15 mg/L — ABNORMAL HIGH (ref 0–10)

## 2020-03-12 ENCOUNTER — Encounter: Payer: Self-pay | Admitting: Sports Medicine

## 2020-03-19 ENCOUNTER — Encounter: Payer: Self-pay | Admitting: Family Medicine

## 2020-03-19 ENCOUNTER — Ambulatory Visit (INDEPENDENT_AMBULATORY_CARE_PROVIDER_SITE_OTHER): Payer: Medicare HMO | Admitting: Family Medicine

## 2020-03-19 ENCOUNTER — Other Ambulatory Visit: Payer: Self-pay

## 2020-03-19 VITALS — BP 137/53 | HR 55 | Temp 98.7°F | Resp 16 | Ht 63.5 in | Wt 145.0 lb

## 2020-03-19 DIAGNOSIS — L989 Disorder of the skin and subcutaneous tissue, unspecified: Secondary | ICD-10-CM | POA: Diagnosis not present

## 2020-03-19 DIAGNOSIS — M4726 Other spondylosis with radiculopathy, lumbar region: Secondary | ICD-10-CM | POA: Diagnosis not present

## 2020-03-19 DIAGNOSIS — G8929 Other chronic pain: Secondary | ICD-10-CM | POA: Diagnosis not present

## 2020-03-19 DIAGNOSIS — M25552 Pain in left hip: Secondary | ICD-10-CM

## 2020-03-19 DIAGNOSIS — I10 Essential (primary) hypertension: Secondary | ICD-10-CM

## 2020-03-19 DIAGNOSIS — E782 Mixed hyperlipidemia: Secondary | ICD-10-CM | POA: Diagnosis not present

## 2020-03-19 DIAGNOSIS — M5442 Lumbago with sciatica, left side: Secondary | ICD-10-CM

## 2020-03-19 MED ORDER — HYDROCODONE-ACETAMINOPHEN 5-325 MG PO TABS: 1.0000 | ORAL_TABLET | ORAL | 0 refills | Status: DC | PRN

## 2020-03-19 MED ORDER — TRIAMCINOLONE ACETONIDE 0.5 % EX CREA: 1.0000 "application " | TOPICAL_CREAM | Freq: Two times a day (BID) | CUTANEOUS | 1 refills | Status: DC

## 2020-03-19 MED ORDER — TIZANIDINE HCL 4 MG PO TABS: 4.0000 mg | ORAL_TABLET | Freq: Three times a day (TID) | ORAL | 2 refills | Status: DC | PRN

## 2020-03-19 NOTE — Patient Instructions (Addendum)
Thank you for coming to the office today.  Referral sent, stay tuned for apt. They will call.  [facility]) [facility] at [address] [place], [place] [address] Main: [phone]  -----------------------  Stop Tramadol. Start Hydrocodone-acetaminophen take as needed, can refill if need within 2 weeks.  Stop baclofen Start Tizanidine muscle relaxant, same dosing, can use half or whole as needed, caution sedation.  Continue other meds - tylenol, lidocaine, celebrex, voltaren  Calculate tylenol dosing - each hydrocodone has 325mg  of acetaminophen in it. Make sure total daily dose acetaminophen is equal to or less than 3000mg  per 24 hours.  - For leg [place] topical triamcinolone twice a day for 2 weeks then can stop, f/u with Dr Nehemiah Massed further.  Please schedule a Follow-up Appointment to: Return in about 4 weeks (around 04/16/2020), or if symptoms worsen or fail to improve, for back pain.  If you have any other questions or concerns, please feel free to call the office or send a message through [place]. You may also schedule an earlier appointment if necessary.  Additionally, you may be receiving a survey about your experience at our office within a few days to 1 week by e-mail or mail. We value your feedback.  Nobie Putnam, DO [place]

## 2020-03-19 NOTE — Progress Notes (Signed)
Subjective:    Patient ID: Robin Lang, female    DOB: May 23, 1951, 69 y.o.   MRN: [id number]  Robin Lang is a 69 y.o. female presenting on 03/19/2020 for Back Pain   HPI   Chronic Low Back Pain / Left Hip Pain / Osteoarthritis DDD with Sciatica / Radiculopathy - Last visit with me 02/27/20, for initial visit for same problem, treated with med management celebrex prednisone, see prior notes for background information. - Interval update with while on Prednisone higher dose taper over 12 days, her pain was down to 4 out of 10, significant improved while on steroid, then off medicine seemed to worse. She is taking Celebrex '100mg'$  daily. She was referred, Dr Micheline Chapman 03/07/20 Labs done for rheumatological work up, all negative except CRP 15 mild positive. Had negative Anti CCP, RF, ESR. Scheduled Lumbar spine MRI 04/02/20 - She will still stay active and do yard work with back brace and using heat / ice - Taking Tylenol, Celebrex, Baclofen - Not using Lidocaine patches regularly. No longer on Tramadol - was ineffective. - Using Voltaren topical PRN. Some temporary relief. - Severe back pain still as reported with radicular radiating pain. Limited relief now. Affecting her function more - Note previous history - Followed by [facility] - Dr Earnestine Leys and eventually referred to Dr Kurtis Bushman - Prior history years ago spinal injection with good results. Admits weakness due to pain Denies any trauma fall injury, redness, numbness tingling  Non healing skin lesion Left Leg Lower Leg anterior persistent 2-3 month, has apt in July, prior dermatitis, using hydrogen peroxide now and some topical bacitracin without resolution, seems to be raised now and scab. - Routine Dermatology visit Dr Nehemiah Massed (01/2020) had minor "nick" in skin but was minor and did not think much of it - Denies any spreading redness drainage of pus or itching or pain  Fam history of Cardiovascular Disease / Hypertension /  Hyperlipidemia Requesting Referral to Cardiology today for evaluation since she has known history of High Blood pressure and Cholesterol, she takes Verapamil, Simvastatin, and Losartan and was asking if she needs any other medicines to help prevent heart disease. She has strong fam history on both sides - heart disease in family both sides, father had MI passed in his 82s - Denies any active dyspnea or chest pain or other cardiac symptoms at this time.   Depression screen [facility] 2/9 12/11/2019 08/23/2019 07/27/2018  Decreased Interest 0 0 0  Down, Depressed, Hopeless 0 0 0  PHQ - 2 Score 0 0 0    Social History   Tobacco Use  . Smoking status: Former Smoker    Packs/day: 0.50    Years: 9.00    Pack years: 4.50    Quit date: 1989    Years since quitting: 32.3  . Smokeless tobacco: Never Used  Substance Use Topics  . Alcohol use: Yes    Alcohol/week: 2.0 standard drinks    Types: 2 Glasses of wine per week    Comment: occ  . Drug use: Never    Review of Systems Per HPI unless specifically indicated above     Objective:    BP (!) 137/53   Pulse (!) 55   Temp 98.7 F (37.1 C) (Temporal)   Resp 16   Ht 5' 3.5" (1.613 m)   Wt 145 lb (65.8 kg)   BMI 25.28 kg/m   Wt Readings from Last 3 Encounters:  03/19/20 145 lb (65.8 kg)  03/07/20  146 lb (66.2 kg)  02/27/20 147 lb 9.6 oz (67 kg)    Physical Exam Vitals and nursing note reviewed.  Constitutional:      General: She is not in acute distress.    Appearance: She is well-developed. She is not diaphoretic.     Comments: Well-appearing, uncomfortable with back pain, cooperative  HENT:     Head: Normocephalic and atraumatic.  Eyes:     General:        Right eye: No discharge.        Left eye: No discharge.     Conjunctiva/sclera: Conjunctivae normal.  Neck:     Thyroid: No thyromegaly.  Cardiovascular:     Rate and Rhythm: Normal rate and regular rhythm.     Heart sounds: Normal heart sounds. No murmur.  Pulmonary:      Effort: Pulmonary effort is normal. No respiratory distress.     Breath sounds: Normal breath sounds. No wheezing or rales.  Musculoskeletal:        General: Normal range of motion.     Cervical back: Normal range of motion and neck supple.     Comments:  Low Back / L hip and Greater Trochanter Inspection: BACK - Normal appearance, no spinal deformity, symmetrical. HIP - Normal appearance, symmetrical, no obvious leg length or pelvis deformity  Palpation: Left Knee with some increased effusion it appears, no active joint line tenderness.  BACK - No tenderness over spinous processes. Bilateral lumbar paraspinal muscles non-tender but with some  hypertonicity/spasm. HIP - Mild to moderate tender to palpation deeper L greater trochanter region of lateral upper thigh. Lower extremity thigh calf soft non tender no spasm.  ROM: BACK - Full active ROM forward flex / back extension, rotation L/R without discomfort HIP - Bilateral hip flex/ext supine normal, internal and external rotation normal without problem or limitation.  Special Testing: BACK - Seated SLR positive for radicular pain and localized L hip pain  HIP - Intact left hip range of motion testing, limited exam due to severity of pain.  Strength: Bilateral hip flex/ext 5/5, knee flex/ext 5/5, ankle dorsiflex/plantarflex 5/5 Neurovascular: intact distal sensation to light touch  Lymphadenopathy:     Cervical: No cervical adenopathy.  Skin:    General: Skin is warm and dry.     Findings: No erythema or rash.  Neurological:     Mental Status: She is alert and oriented to person, place, and time.  Psychiatric:        Behavior: Behavior normal.     Comments: Well groomed, good eye contact, normal speech and thoughts    Results for orders placed or performed in visit on 03/07/20  Sedimentation rate  Result Value Ref Range   Sed Rate 2 0 - 40 mm/hr  C-reactive protein  Result Value Ref Range   CRP 15 (H) 0 - 10 mg/L    Antinuclear Antib (ANA)  Result Value Ref Range   Anti Nuclear Antibody (ANA) Negative Negative  Rheumatoid Factor  Result Value Ref Range   Rhuematoid fact SerPl-aCnc <10.0 0.0 - 24.2 IU/mL  CYCLIC CITRUL PEPTIDE ANTIBODY, IGG/IGA  Result Value Ref Range   Cyclic Citrullin Peptide Ab 6 0 - 19 units      Assessment & Plan:   Problem List Items Addressed This Visit    Osteoarthritis of spine with radiculopathy, lumbar region   Relevant Medications   tiZANidine (ZANAFLEX) 4 MG tablet   HYDROcodone-acetaminophen (NORCO/VICODIN) 5-325 MG tablet   Mixed hyperlipidemia  Essential hypertension   Chronic left-sided low back pain with left-sided sciatica - Primary   Relevant Medications   tiZANidine (ZANAFLEX) 4 MG tablet   HYDROcodone-acetaminophen (NORCO/VICODIN) 5-325 MG tablet   Chronic left hip pain   Relevant Medications   tiZANidine (ZANAFLEX) 4 MG tablet   HYDROcodone-acetaminophen (NORCO/VICODIN) 5-325 MG tablet    Other Visit Diagnoses    Skin lesion of left leg       Relevant Medications   triamcinolone cream (KENALOG) 0.5 %      #Chronic Low Back pain / Osteoarthritis DDD with radiculopathy Complex history of chronic worsening severe back pain, see last note for details Last X-ray reviewed Already seen [facility] Dr Micheline Chapman 4/22, has upcoming MRI Lumbar spine scheduled for 2 weeks from now 04/02/20, awaiting results and further treatment, would be indicated for ESI injection for diagnostic vs therapeutic and possibly ortho vs spine specialty if indicated. - Failed Tramadol, Gabapentin - Continue Tylenol, Voltaren, topical therapy - may continue Celebrex if helping - STOP Baclofen. START Tizanidine 2-'4mg'$  TID PRN muscle spasms - STOP Tramadol. START Hydrocodone-Acetaminophen 5/325 take 1-2 tab q 4 hr PRN for short term, rx #60, 0 refill , only limited use now while awaiting MRI and further treatment options to help her maintain function and treat acute pain however  this is a chronic pain problem and would not be able to manage on this rx for long-term. She is aware. Risks reviewed  #Abnormal lesion, L Leg Non healing, raised, scab area, no sign of secondary infection Seems inadequate therapy, not improve on bacitracin or topical peroxide - Advised stop peroxide seems to be preventing it from healing - May have poor healing with reduced circulation / varicose veins / venous stasis as well - Can use topical Triamcinolone rx cream 0.5% BID 1-2 weeks for now for helping treat the dry irritated lesion, has history of dermatitis, already has apt w/ established Dermatology for re-eval in 2 months if not healing still may need cryotherapy.  #HTN / HLD / Cardiovascular Risk Factors On med management for HTN and Hyperlipidemia currently Encouraged patient to continue meds, continue improving lifestyle, now it is limited by back pain Given family history of cardiovascular heart disease - will proceed with referral to [facility] they can discuss options for future screening / testing for possible underlying ASCVD, may benefit from CT Coronary calcium scoring, prior imaging has incidental ASCVD identified.  Orders Placed This Encounter  Procedures  . Ambulatory referral to Cardiology    Referral Priority:   Routine    Referral Type:   Consultation    Referral Reason:   Specialty Services Required    Requested Specialty:   Cardiology    Number of Visits Requested:   1    Meds ordered this encounter  Medications  . triamcinolone cream (KENALOG) 0.5 %    Sig: Apply 1 application topically 2 (two) times daily. To affected areas, for up to 2 weeks.    Dispense:  30 g    Refill:  1  . tiZANidine (ZANAFLEX) 4 MG tablet    Sig: Take 1 tablet (4 mg total) by mouth every 8 (eight) hours as needed for muscle spasms.    Dispense:  60 tablet    Refill:  2    Discontinue Baclofen. Switch of therapy  . HYDROcodone-acetaminophen (NORCO/VICODIN) 5-325 MG  tablet    Sig: Take 1-2 tablets by mouth every 4 (four) hours as needed for moderate pain.  Dispense:  60 tablet    Refill:  0    Discontinue Tramadol. Switch of therapy.      Follow up plan: Return in about 4 weeks (around 04/16/2020), or if symptoms worsen or fail to improve, for back pain.   Nobie Putnam, [facility] 03/19/2020, 10:22 AM

## 2020-03-21 ENCOUNTER — Ambulatory Visit: Payer: Medicare HMO | Admitting: Family Medicine

## 2020-03-25 ENCOUNTER — Ambulatory Visit: Payer: Medicare HMO | Admitting: Cardiology

## 2020-03-26 ENCOUNTER — Ambulatory Visit
Admission: RE | Admit: 2020-03-26 | Discharge: 2020-03-26 | Disposition: A | Discharge status: Home / Self Care | Payer: Medicare HMO | Source: Ambulatory Visit | Attending: Sports Medicine | Admitting: Sports Medicine

## 2020-03-26 ENCOUNTER — Other Ambulatory Visit: Payer: Self-pay

## 2020-03-26 ENCOUNTER — Ambulatory Visit: Payer: Medicare HMO | Admitting: Sports Medicine

## 2020-03-26 VITALS — BP 136/68 | Ht 63.5 in | Wt 145.0 lb

## 2020-03-26 DIAGNOSIS — M25562 Pain in left knee: Secondary | ICD-10-CM

## 2020-03-26 DIAGNOSIS — G8929 Other chronic pain: Secondary | ICD-10-CM

## 2020-03-26 DIAGNOSIS — M1712 Unilateral primary osteoarthritis, left knee: Secondary | ICD-10-CM

## 2020-03-26 MED ORDER — METHYLPREDNISOLONE ACETATE 40 MG/ML IJ SUSP
40.0000 mg | Freq: Once | INTRAMUSCULAR | Status: AC
  Administered 2020-03-26: 40 mg via INTRA_ARTICULAR

## 2020-03-26 NOTE — Progress Notes (Signed)
   Subjective:    Patient ID: Robin Lang    DOB: 16-Oct-1951, 69 y.o.   MRN: [id number]  HPI Robin Lang is a 69yo female with PMH osteopenia, chronic back pain and left lumbar radiculopathy, left meniscectomy, hypertension, and baker's cyst of right knee dx in 2015 presenting with left knee swelling and pain. She states the swelling and pain worsens throughout the day. The swelling is worsened by walking around, and her knee pain is distracted by her left radiculopathy which extends along her lateral thigh. She has not had recent trauma or falls.  She has had previous x-rays of the knee and was told she has arthritis and will need a knee repair at some point about a year and a half ago, but she declined.   Review of Systems Per HPI    Objective:   Physical Exam  Constitution: NAD, appears stated age HENT: [place]/AT MSK: normal gait, diffuse pain with valgus and varus and TTP along medial knee joint. Full active and passive ROM, +crepitus, no palpable effusion Neuro: a&o, normal affect Skin: c/d/i, no erythema or warmth  Bedside US Small effusion superior aspect of left knee.      Assessment & Plan:   Arthritis Left Knee She has a history of arthritis in her left knee and physical exam is consistent with this previous diagnosis. She has been told in the past she will likely need repair at some point. Her pain is likely exacerbated by compensation for her radiculopathy, which may be effecting her ROM and gait.    - depo-medrol steroid injection today  - x-ray left knee AP/lateral/sunrise   Molli Hazard A, DO 03/26/2020, 9:31 AM Pager: [phone]  Patient seen and evaluated with the resident.  I agree with the above plan of care.  Patient's left knee was injected with cortisone today.  An anterior lateral approach was utilized.  Patient tolerated this without difficulty.  X-rays of her left knee show advanced medial compartmental DJD.  She is approaching bone-on-bone in the medial  compartment.  Definitive treatment is total knee arthroplasty.  Follow-up for ongoing or recalcitrant issues.  Consent obtained and verified. Time-out conducted. Noted no overlying erythema, induration, or other signs of local infection. Skin prepped in a sterile fashion. Topical analgesic spray: Ethyl chloride. Joint: left knee Needle: 25g 1.5 inch Completed without difficulty. Meds: 3cc 1% xylocaine, 1cc (40mg ) depomedrol  Advised to call if fevers/chills, erythema, induration, drainage, or persistent bleeding.

## 2020-03-27 ENCOUNTER — Encounter: Payer: Self-pay | Admitting: Sports Medicine

## 2020-03-28 DIAGNOSIS — G8929 Other chronic pain: Secondary | ICD-10-CM

## 2020-03-28 DIAGNOSIS — M25552 Pain in left hip: Secondary | ICD-10-CM

## 2020-03-28 DIAGNOSIS — M4726 Other spondylosis with radiculopathy, lumbar region: Secondary | ICD-10-CM

## 2020-03-28 MED ORDER — CYCLOBENZAPRINE HCL 10 MG PO TABS: 5.0000 mg | ORAL_TABLET | Freq: Three times a day (TID) | ORAL | 2 refills | Status: DC | PRN

## 2020-03-28 MED ORDER — PREDNISONE 20 MG PO TABS: ORAL_TABLET | ORAL | 0 refills | Status: DC

## 2020-03-28 MED ORDER — LIDOCAINE 5 % EX PTCH: 1.0000 | MEDICATED_PATCH | CUTANEOUS | 2 refills | Status: DC

## 2020-04-02 ENCOUNTER — Ambulatory Visit
Admission: RE | Admit: 2020-04-02 | Discharge: 2020-04-02 | Disposition: A | Discharge status: Home / Self Care | Payer: Medicare HMO | Source: Ambulatory Visit | Attending: Sports Medicine | Admitting: Sports Medicine

## 2020-04-02 DIAGNOSIS — G8929 Other chronic pain: Secondary | ICD-10-CM

## 2020-04-02 DIAGNOSIS — M48061 Spinal stenosis, lumbar region without neurogenic claudication: Secondary | ICD-10-CM | POA: Diagnosis not present

## 2020-04-03 DIAGNOSIS — M4726 Other spondylosis with radiculopathy, lumbar region: Secondary | ICD-10-CM

## 2020-04-03 DIAGNOSIS — G8929 Other chronic pain: Secondary | ICD-10-CM

## 2020-04-03 DIAGNOSIS — M25552 Pain in left hip: Secondary | ICD-10-CM

## 2020-04-04 ENCOUNTER — Ambulatory Visit (INDEPENDENT_AMBULATORY_CARE_PROVIDER_SITE_OTHER): Payer: Medicare HMO | Admitting: Sports Medicine

## 2020-04-04 ENCOUNTER — Other Ambulatory Visit: Payer: Self-pay

## 2020-04-04 VITALS — BP 143/67 | Ht 63.5 in | Wt 145.0 lb

## 2020-04-04 DIAGNOSIS — M48061 Spinal stenosis, lumbar region without neurogenic claudication: Secondary | ICD-10-CM | POA: Diagnosis not present

## 2020-04-04 MED ORDER — HYDROCODONE-ACETAMINOPHEN 5-325 MG PO TABS: 1.0000 | ORAL_TABLET | ORAL | 0 refills | Status: DC | PRN

## 2020-04-04 NOTE — Progress Notes (Signed)
Patient ID: Robin Lang, female   DOB: January 06, 1951, 69 y.o.   MRN: [id number]  Patient presents today to discuss MRI findings of her lumbar spine.  She has severe spinal stenosis at L3 and L4.  She has severe left-sided foraminal stenosis at L5-S1.  She continues to have significant left hip and left leg pain.  Based on these findings, I recommend consultation with Dr. Lynann Bologna to discuss merits of possible surgical decompression.  In the meantime, her PCP has prescribed a 12-day course of prednisone.  She is instructed to stop her Celebrex while on prednisone.  I will defer further work-up and treatment to the discretion of Dr. Lynann Bologna and the patient will follow up with me as needed.  Total time spent with the patient was 15 minutes with 100% of the time spent in face-to-face consultation discussing her MRI findings and demonstrating those findings using a lumbar spine model.

## 2020-04-04 NOTE — Patient Instructions (Signed)
[name], MD [facility] [address]  [place], [place] [address] Phone: [phone] Friday June 4th at [facility] time is 845am  **Remember to stop the Celebrex while you are on the Prednisone

## 2020-04-05 MED ORDER — HYDROCODONE-ACETAMINOPHEN 5-325 MG PO TABS: 1.0000 | ORAL_TABLET | ORAL | 0 refills | Status: DC | PRN

## 2020-04-05 NOTE — Addendum Note (Signed)
Addended by: Olin Hauser on: 04/05/2020 11:41 AM   Modules accepted: Orders

## 2020-04-19 DIAGNOSIS — M5416 Radiculopathy, lumbar region: Secondary | ICD-10-CM | POA: Diagnosis not present

## 2020-04-23 ENCOUNTER — Encounter: Payer: Self-pay | Admitting: Family Medicine

## 2020-04-23 ENCOUNTER — Telehealth: Payer: Self-pay

## 2020-04-23 ENCOUNTER — Telehealth (INDEPENDENT_AMBULATORY_CARE_PROVIDER_SITE_OTHER): Payer: Medicare HMO | Admitting: Family Medicine

## 2020-04-23 ENCOUNTER — Other Ambulatory Visit: Payer: Self-pay

## 2020-04-23 DIAGNOSIS — H524 Presbyopia: Secondary | ICD-10-CM | POA: Diagnosis not present

## 2020-04-23 DIAGNOSIS — M5442 Lumbago with sciatica, left side: Secondary | ICD-10-CM

## 2020-04-23 DIAGNOSIS — M48062 Spinal stenosis, lumbar region with neurogenic claudication: Secondary | ICD-10-CM | POA: Insufficient documentation

## 2020-04-23 DIAGNOSIS — M48061 Spinal stenosis, lumbar region without neurogenic claudication: Secondary | ICD-10-CM

## 2020-04-23 DIAGNOSIS — H2511 Age-related nuclear cataract, right eye: Secondary | ICD-10-CM | POA: Diagnosis not present

## 2020-04-23 DIAGNOSIS — Z9842 Cataract extraction status, left eye: Secondary | ICD-10-CM | POA: Diagnosis not present

## 2020-04-23 DIAGNOSIS — G8929 Other chronic pain: Secondary | ICD-10-CM

## 2020-04-23 DIAGNOSIS — M4726 Other spondylosis with radiculopathy, lumbar region: Secondary | ICD-10-CM | POA: Diagnosis not present

## 2020-04-23 MED ORDER — HYDROCODONE-ACETAMINOPHEN 5-325 MG PO TABS: 1.0000 | ORAL_TABLET | ORAL | 0 refills | Status: DC | PRN

## 2020-04-23 NOTE — Patient Instructions (Addendum)
Re ordered Hydrocodone 5/325 #30 pills 0 refills for now, goal to see Dr Mina Marble on 6/15 then discuss next options. We can continue this med short term, but eventually after injection or procedure will need to discuss with orthopedic or pain specialist in future on medications if they are still needed.  Message on mychart for updates. If we need to change plan or new rx controlled med like Hydrocodone we can setup mychart visit.  Please schedule a Follow-up Appointment to: Return in about 4 weeks (around 05/21/2020), or if symptoms worsen or fail to improve, for back pain.  If you have any other questions or concerns, please feel free to call the office or send a message through [place]. You may also schedule an earlier appointment if necessary.  Additionally, you may be receiving a survey about your experience at our office within a few days to 1 week by e-mail or mail. We value your feedback.  Nobie Putnam, DO [place]

## 2020-04-23 NOTE — Telephone Encounter (Signed)
Called patient. No answer. LMOV.   Need to verify what office she has seen in [place] so we can get records.

## 2020-04-23 NOTE — Progress Notes (Signed)
Subjective:    Patient ID: Robin Lang, female    DOB: [birth year], 69 y.o.   MRN: [id number]  Robin Lang is a 69 y.o. female presenting on 04/23/2020 for Leg Pain (medication question)  Virtual / Telehealth Encounter - Video Visit via MyChart The purpose of this virtual visit is to provide medical care while limiting exposure to the novel coronavirus (COVID19) for both patient and office staff.  Consent was obtained for remote visit:  Yes.   Answered questions that patient had about telehealth interaction:  Yes.   I discussed the limitations, risks, security and privacy concerns of performing an evaluation and management service by video/telephone. I also discussed with the patient that there may be a patient responsible charge related to this service. The patient expressed understanding and agreed to proceed.  Patient Location: Home Provider Location: [facility] (Office)   HPI   ChronicLow Back Pain / Left Hip Pain / Osteoarthritis DDD with Spinal Stenosis Radiculopathy - Last visit with me 03/19/20, for same problem, treated with med management celebrex prednisone, see prior notes for background information. - Interval updates - Lumbar MRI 04/02/20 showed severe spinal stenosis multiple sites lumbar spine, she has followed up with Sports Med Dr Micheline Chapman for MRI results, she was referred to Dr Lynann Bologna ([facility]), seen on 04/19/20 ultimately scheduled with Dr Mina Marble ([facility]) for spinal injections. - Regarding treatment she has been on Prednisone burst taper again in mid May 2021 with significant improvement allowed her to travel for her grandson's graduation. She was very pleased about this. She also has been on Celebrex and Tylenol, Flexeril and Hydrocodone PRN. - Last order Hydrocodone 5/325 #30 pills on 5/21 lasted her until today was last pill taken. She is taking 1-3 times a day PRN if has this med with some good results of pain  control.  Upcoming apt with Dr Mina Marble for 04/30/20 consultation, then they will schedule injections.  She will still stay active and do yard work with back brace and using heat / ice Using Voltaren topical PRN. Some temporary relief.  Still when out of meds or not taking. She endorses severe back pain still as reported with radicular radiating pain. Limited relief now. Affecting her function more  Prior history years ago spinal injection with good results. Admits weakness due to pain Denies any trauma fall injury, redness, numbness tingling    Depression screen [facility] 2/9 04/23/2020 12/11/2019 08/23/2019  Decreased Interest 0 0 0  Down, Depressed, Hopeless 0 0 0  PHQ - 2 Score 0 0 0    Social History   Tobacco Use  . Smoking status: Former Smoker    Packs/day: 0.50    Years: 9.00    Pack years: 4.50    Quit date: 1989    Years since quitting: 32.4  . Smokeless tobacco: Never Used  Substance Use Topics  . Alcohol use: Yes    Alcohol/week: 2.0 standard drinks    Types: 2 Glasses of wine per week    Comment: occ  . Drug use: Never    Review of Systems Per HPI unless specifically indicated above     Objective:    There were no vitals taken for this visit.  Wt Readings from Last 3 Encounters:  04/04/20 145 lb (65.8 kg)  03/26/20 145 lb (65.8 kg)  03/19/20 145 lb (65.8 kg)    Physical Exam   Note examination was completely remotely via video observation objective  data only  Gen - well-appearing, no acute distress or apparent pain, comfortable HEENT - eyes appear clear without discharge or redness Heart/Lungs - cannot examine virtually - observed no evidence of coughing or labored breathing. Skin - face visible today- no rash Neuro - awake, alert, oriented Psych - not anxious appearing   Results for orders placed or performed in visit on 03/07/20  Sedimentation rate  Result Value Ref Range   Sed Rate 2 0 - 40 mm/hr  C-reactive protein  Result Value Ref Range   CRP  15 (H) 0 - 10 mg/L  Antinuclear Antib (ANA)  Result Value Ref Range   Anti Nuclear Antibody (ANA) Negative Negative  Rheumatoid Factor  Result Value Ref Range   Rhuematoid fact SerPl-aCnc <10.0 0.0 - 28.3 IU/mL  CYCLIC CITRUL PEPTIDE ANTIBODY, IGG/IGA  Result Value Ref Range   Cyclic Citrullin Peptide Ab 6 0 - 19 units   I have personally reviewed the radiology report from MRI Lumbar Spine on 04/02/20.  CLINICAL DATA:  Chronic left-sided low back pain with left-sided sciatica. Rule out disc herniation; lumbar radiculopathy, greater than 6 weeks. Additional history provided by scanning technologist: Patient reports low back pain and left leg pain for 15 months, muscle spasms.  EXAM: MRI LUMBAR SPINE WITHOUT CONTRAST  TECHNIQUE: Multiplanar, multisequence MR imaging of the lumbar spine was performed. No intravenous contrast was administered.  COMPARISON:  Lumbar spine radiographs 02/27/2020  FINDINGS: Segmentation:  5 lumbar vertebrae  Alignment: Lumbar levocurvature. Mild grade 1 retrolisthesis at L1-L2, L3-L4 and L4-L5.  Vertebrae: There is nonspecific diffuse heterogeneity of the marrow signal. Vertebral body height is maintained. Multilevel degenerative endplate irregularity and mixed degenerative endplate marrow signal. This includes mild multilevel degenerative endplate edema, most notably at L3-L4.  Conus medullaris and cauda equina: Conus extends to the L1-L2 level. No signal abnormality within the visualized distal spinal cord.  Paraspinal and other soft tissues: 18 mm T2 hyperintense lesion within the imaged right hepatic lobe which is incompletely assessed but may reflect a cyst. Atrophy of the lumbar paraspinal musculature.  Disc levels:  Multilevel disc degeneration. Most notably, there is moderate/advanced disc degeneration at L2-L3, L3-L4, L4-L5 and L5-S1.  T12-L1: Shallow left center/subarticular disc protrusion. Minimal effacement of  ventral thecal sac without significant central canal stenosis. Neural foramina patent.  L1-L2: Grade 1 retrolisthesis. Small disc bulge. Mild facet arthrosis/ligamentum flavum hypertrophy. No significant spinal canal or foraminal narrowing.  L2-L3: Trace retrolisthesis. Disc bulge. Superimposed broad-based right center to right foraminal disc protrusion. Mild facet arthrosis/ligamentum flavum hypertrophy. Mild right subarticular narrowing with contact upon descending right-sided nerve roots. Central canal patent. Moderate right neural foraminal narrowing.  L3-L4: Disc bulge asymmetric to the right. Associated osteophyte ridge greatest along the right lateral aspect of the disc space. Facet arthrosis/ligamentum flavum hypertrophy. Small bilateral facet joint effusions. Small posteriorly projecting synovial facet cysts. Severe spinal canal stenosis. Bilateral neural foraminal narrowing (moderate/severe right, mild left).  L4-L5: Grade 1 retrolisthesis. Disc bulge asymmetric to the left. Osteophyte ridge greatest along the left lateral aspect of the disc space. Moderate facet arthrosis with prominent ligamentum flavum hypertrophy. Small bilateral facet joint effusions. Small posteriorly projecting synovial facet cysts. Severe spinal canal stenosis. Bilateral neural foraminal narrowing (mild right, severe left).  L5-S1: Mild disc bulge with endplate spurring. Moderate facet arthrosis/ligamentum flavum hypertrophy. No significant spinal canal stenosis. Bilateral neural foraminal narrowing (moderate right, severe left).  IMPRESSION: Lumbar spondylosis as outlined and most notably as follows.  At L3-L4, there is  multifactorial severe spinal canal stenosis. Bilateral neural foraminal narrowing (moderate/severe right, mild left).  At L4-L5, there is multifactorial severe spinal canal stenosis. Bilateral neural foraminal narrowing (mild right, severe left).  At L5-S1, there  is multifactorial bilateral neural foraminal narrowing (moderate right, severe left).  Levels of lesser spinal canal stenosis as described. Additional sites of neural foraminal narrowing as detailed and greatest on the right at L2-L3 (moderate in severity at this site).   Electronically Signed   By: Kellie Simmering DO   On: 04/03/2020 08:46    Assessment & Plan:   Problem List Items Addressed This Visit    Spinal stenosis of lumbar region at multiple levels - Primary   Osteoarthritis of spine with radiculopathy, lumbar region   Relevant Medications   HYDROcodone-acetaminophen (NORCO/VICODIN) 5-325 MG tablet   Chronic left-sided low back pain with left-sided sciatica   Relevant Medications   HYDROcodone-acetaminophen (NORCO/VICODIN) 5-325 MG tablet      Chronic Low Back pain / Osteoarthritis with Spinal Stenosis multilevel DDD with radiculopathy Complex history of chronic worsening severe back pain, see last note for details Last MRI 04/02/20 Lumbar spine, see above Followed by Dr Micheline Chapman (Sports Med), Dr Lynann Bologna [facility]), upcoming apt Dr Mina Marble ([facility], [facility] for injections)  - Failed Tramadol, Gabapentin  Upcoming Apt 04/30/20 for consultation and future scheduling [facility] Dr Mina Marble  Improved significantly / temporary on prednisone Pain interval managed on Hydrocodone / tylenol, flexeril, voltaren PRN - Out of hydrocodone today, 30 pills lasted from 5/21 to 6/8. Taking appropriately 1-3 per day PRN, can increase to max dose 6 in 24 hours if acute severe pain. Checked PDMP  Agree to refill Hydrocodone-Acetaminophen 5/325 take 1 tab q 4 hr PRN for short term, rx #30, 0 refill , only limited use awaiting upcoming procedure with [facility] now. Ultimately may discuss pain control options with Orthpedic post-injection and if other therapy is indicated. She is aware of risks on opiate.   Meds ordered this encounter  Medications  . HYDROcodone-acetaminophen  (NORCO/VICODIN) 5-325 MG tablet    Sig: Take 1 tablet by mouth every 4 (four) hours as needed for moderate pain. Max 6 tab in 24 hour    Dispense:  30 tablet    Refill:  0    Changed to 1 pill q 4 hr - max 6 tab in 24 hour    Follow-up: - Return in 2-4 weeks back pain as needed if need refills  Patient verbalizes understanding with the above medical recommendations including the limitation of remote medical advice.  Specific follow-up and call-back criteria were given for patient to follow-up or seek medical care more urgently if needed.  Total duration of direct patient care provided via video conference: 15 minutes  Nobie Putnam, [facility] 04/23/2020, 4:30 PM

## 2020-04-30 DIAGNOSIS — M5416 Radiculopathy, lumbar region: Secondary | ICD-10-CM | POA: Diagnosis not present

## 2020-05-02 ENCOUNTER — Encounter: Payer: Self-pay | Admitting: Cardiology

## 2020-05-02 ENCOUNTER — Other Ambulatory Visit: Payer: Self-pay

## 2020-05-02 ENCOUNTER — Ambulatory Visit: Payer: Medicare HMO | Admitting: Cardiology

## 2020-05-02 VITALS — BP 130/60 | HR 82 | Ht 63.0 in | Wt 141.4 lb

## 2020-05-02 DIAGNOSIS — I10 Essential (primary) hypertension: Secondary | ICD-10-CM

## 2020-05-02 DIAGNOSIS — I739 Peripheral vascular disease, unspecified: Secondary | ICD-10-CM

## 2020-05-02 DIAGNOSIS — E7800 Pure hypercholesterolemia, unspecified: Secondary | ICD-10-CM | POA: Diagnosis not present

## 2020-05-02 DIAGNOSIS — R011 Cardiac murmur, unspecified: Secondary | ICD-10-CM

## 2020-05-02 MED ORDER — ATORVASTATIN CALCIUM 40 MG PO TABS
40.0000 mg | ORAL_TABLET | Freq: Every day | ORAL | 6 refills | Status: DC
Start: 2020-05-02 — End: 2020-11-29

## 2020-05-02 NOTE — Progress Notes (Signed)
Cardiology Office Note:    Date:  05/02/2020   ID:  Robin Lang, DOB [birth year], MRN [id number]  PCP:  Olin Hauser, DO  [place] Cardiologist:  Kate Sable, MD  [place] Electrophysiologist:  None   Referring MD: Nobie Putnam *   Chief Complaint  Patient presents with  . New Patient (Initial Visit)    Ref by Dr. Parks Ranger for mixed hyperlipidemia and HTN. Pt. recently moved to [place] from [place] and need to establish care.     History of Present Illness:    Robin Lang is a 69 y.o. female with a hx of hypertension, hyperlipidemia, former smoker x15 years, DDD of lower back causing sciatica, who presents due to hyperlipidemia and to establish care.  Patient states having a family history of heart attacks in her dad around age 23s.  She feels well, denies chest pain or shortness of breath.  Patient also noted to have atherosclerotic disease of the peripheral arteries on MRI to evaluate sciatica.  She recently slept poorly and had right shoulder pain.  Had a massage which improved her symptoms.  Since then she has found it difficult to raise her right hand.  Planning to see Ortho/sports medicine for that.   Past Medical History:  Diagnosis Date  . Baker's cyst of knee, right 2015  . Diverticulosis   . Hyperlipidemia   . Hypertension   . Osteopenia     Past Surgical History:  Procedure Laterality Date  . CATARACT EXTRACTION Right 2017  . MENISCUS REPAIR Left 2012    Current Medications: Current Meds  Medication Sig  . diclofenac Sodium (VOLTAREN) 1 % GEL Apply topically 3 (three) times daily as needed.  Marland Kitchen HYDROcodone-acetaminophen (NORCO/VICODIN) 5-325 MG tablet Take 1 tablet by mouth every 4 (four) hours as needed for moderate pain. Max 6 tab in 24 hour  . losartan (COZAAR) 100 MG tablet Take 1 tablet (100 mg total) by mouth daily.  Marland Kitchen triamterene-hydrochlorothiazide (MAXZIDE) 75-50 MG tablet Take 1/2 (one-half) tablet by  mouth once daily  . verapamil (CALAN) 120 MG tablet Take 1 tablet by mouth twice daily  . [DISCONTINUED] simvastatin (ZOCOR) 10 MG tablet Take 1 tablet by mouth once daily     Allergies:   Latex and Neosporin [neomycin-bacitracin zn-polymyx]   Social History   Socioeconomic History  . Marital status: Divorced    Spouse name: Not on file  . Number of children: 2  . Years of education: Not on file  . Highest education level: Some college, no degree  Occupational History  . Not on file  Tobacco Use  . Smoking status: Former Smoker    Packs/day: 0.50    Years: 9.00    Pack years: 4.50    Quit date: 1989    Years since quitting: 32.4  . Smokeless tobacco: Never Used  Vaping Use  . Vaping Use: Never used  Substance and Sexual Activity  . Alcohol use: Yes    Alcohol/week: 2.0 standard drinks    Types: 2 Glasses of wine per week    Comment: occ  . Drug use: Never  . Sexual activity: Not Currently  Other Topics Concern  . Not on file  Social History Narrative  . Not on file   Social Determinants of Health   Financial Resource Strain:   . Difficulty of Paying Living Expenses:   Food Insecurity:   . Worried About Charity fundraiser in the Last Year:   . [employer]  in the Last Year:   Transportation Needs:   . Film/video editor (Medical):   Marland Kitchen Lack of Transportation (Non-Medical):   Physical Activity:   . Days of Exercise per Week:   . Minutes of Exercise per Session:   Stress:   . Feeling of Stress :   Social Connections:   . Frequency of Communication with Friends and Family:   . Frequency of Social Gatherings with Friends and Family:   . Attends Religious Services:   . Active Member of Clubs or Organizations:   . Attends Archivist Meetings:   Marland Kitchen Marital Status:      Family History: The patient's family history includes Heart attack in her father; Heart disease in her father and sister; Lung cancer in her sister; Multiple sclerosis in her  brother; Renal cancer in her mother; Stroke in her mother; Thyroid cancer in her brother. There is no history of Breast cancer.  ROS:   Please see the history of present illness.     All other systems reviewed and are negative.  EKGs/Labs/Other Studies Reviewed:    The following studies were reviewed today:   EKG:  EKG is  ordered today.  The ekg ordered today demonstrates normal sinus rhythm, nonspecific ST changes   Recent Labs: 12/11/2019: ALT 18; BUN 20; Creatinine, Ser 1.19; Hemoglobin 13.3; Platelets 237; Potassium 3.9; Sodium 143  Recent Lipid Panel    Component Value Date/Time   CHOL 223 (H) 08/24/2019 0832   TRIG 88 08/24/2019 0832   HDL 101 08/24/2019 0832   CHOLHDL 2.2 08/24/2019 0832   LDLCALC 107 (H) 08/24/2019 0832    Physical Exam:    VS:  BP 130/60 (BP Location: Right Arm, Patient Position: Sitting, Cuff Size: Normal)   Pulse 82   Ht 5\' 3"  (1.6 m)   Wt 141 lb 6 oz (64.1 kg)   SpO2 98%   BMI 25.04 kg/m     Wt Readings from Last 3 Encounters:  05/02/20 141 lb 6 oz (64.1 kg)  04/04/20 145 lb (65.8 kg)  03/26/20 145 lb (65.8 kg)      GEN:  Well nourished, well developed in no acute distress HEENT: Normal NECK: No JVD; No carotid bruits LYMPHATICS: No lymphadenopathy CARDIAC: RRR, systolic murmur RESPIRATORY:  Clear to auscultation without rales, wheezing or rhonchi  ABDOMEN: Soft, non-tender, non-distended MUSCULOSKELETAL:  No edema; difficulty raising right arm above shoulder level SKIN: Warm and dry NEUROLOGIC:  Alert and oriented x 3 PSYCHIATRIC:  Normal affect   ASSESSMENT:    1. Systolic murmur   2. PAD (peripheral artery disease) ([place])   3. Essential hypertension   4. Pure hypercholesterolemia    PLAN:    In order of problems listed above:  1. Systolic murmur noted on exam.  Get echocardiogram to evaluate any significant valvular pathology. 2. Atherosclerotic cardiovascular disease noted on peripheral arteries on MRI.  Start aspirin 81  mg daily, stop simvastatin, start Lipitor 40 mg daily. 3. History of hypertension, BP well controlled.  Continue current BP meds. 4. History of hyperlipidemia, 10-year ASCVD risk of 12%.  Start Lipitor 40 mg daily, get fasting lipid profile in 3 months.  Follow-up in 3 months after echo and fasting lipid profile.  This note was generated in part or whole with voice recognition software. Voice recognition is usually quite accurate but there are transcription errors that can and very often do occur. I apologize for any typographical errors that were not detected and corrected.  Medication Adjustments/Labs and Tests Ordered: Current medicines are reviewed at length with the patient today.  Concerns regarding medicines are outlined above.  Orders Placed This Encounter  Procedures  . Lipid Profile  . EKG 12-Lead  . ECHOCARDIOGRAM COMPLETE   Meds ordered this encounter  Medications  . atorvastatin (LIPITOR) 40 MG tablet    Sig: Take 1 tablet (40 mg total) by mouth daily.    Dispense:  30 tablet    Refill:  6    Patient Instructions  Medication Instructions:   Your physician has recommended you make the following change in your medication:   1. STOP taking your Simvastatin 2. START taking Lipitor 40mg , one tablet by mouth once a day.   *If you need a refill on your cardiac medications before your next appointment, please call your pharmacy*   Lab Work: Fasting Lipid Panel to be drawn in 3 months (week of 07/29/20)  - You will need to be fasting. Please do not have anything to eat or drink after midnight the morning you have the lab work. You may only have water or black coffee with no cream or sugar.  - Please go to the [facility]. You will check in at the front desk to the right as you walk into the atrium. Valet Parking is offered if needed. - No appointment needed. You may go any day between 7 am and 6 pm.  If you have any lab test that is abnormal or we need to change  your treatment, we will call you to review the results.   Testing/Procedures: Your physician has requested that you have an echocardiogram. Echocardiography is a painless test that uses sound waves to create images of your heart. It provides your doctor with information about the size and shape of your heart and how well your heart's chambers and valves are working. This procedure takes approximately one hour. There are no restrictions for this procedure.   Follow-Up: At [facility], you and your health needs are our priority.  As part of our continuing mission to provide you with exceptional heart care, we have created designated Provider Care Teams.  These Care Teams include your primary Cardiologist (physician) and Advanced Practice Providers (APPs -  Physician Assistants and Nurse Practitioners) who all work together to provide you with the care you need, when you need it.  We recommend signing up for the patient portal called "MyChart".  Sign up information is provided on this After Visit Summary.  MyChart is used to connect with patients for Virtual Visits (Telemedicine).  Patients are able to view lab/test results, encounter notes, upcoming appointments, etc.  Non-urgent messages can be sent to your provider as well.   To learn more about what you can do with MyChart, go to [email or web address].    Your next appointment:    3 months (week of 08/05/20)   The format for your next appointment:   In Person  Provider:   Kate Sable, MD   Other Instructions   Echocardiogram An echocardiogram is a procedure that uses painless sound waves (ultrasound) to produce an image of the heart. Images from an echocardiogram can provide important information about:  Signs of coronary artery disease (CAD).  Aneurysm detection. An aneurysm is a weak or damaged part of an artery wall that bulges out from the normal force of blood pumping through the body.  Heart size and shape. Changes  in the size or shape of the heart can be associated with  certain conditions, including heart failure, aneurysm, and CAD.  Heart muscle function.  Heart valve function.  Signs of a past heart attack.  Fluid buildup around the heart.  Thickening of the heart muscle.  A tumor or infectious growth around the heart valves. Tell a health care provider about:  Any allergies you have.  All medicines you are taking, including vitamins, herbs, eye drops, creams, and over-the-counter medicines.  Any blood disorders you have.  Any surgeries you have had.  Any medical conditions you have.  Whether you are pregnant or may be pregnant. What are the risks? Generally, this is a safe procedure. However, problems may occur, including:  Allergic reaction to dye (contrast) that may be used during the procedure. What happens before the procedure? No specific preparation is needed. You may eat and drink normally. What happens during the procedure?   An IV tube may be inserted into one of your veins.  You may receive contrast through this tube. A contrast is an injection that improves the quality of the pictures from your heart.  A gel will be applied to your chest.  A wand-like tool (transducer) will be moved over your chest. The gel will help to transmit the sound waves from the transducer.  The sound waves will harmlessly bounce off of your heart to allow the heart images to be captured in real-time motion. The images will be recorded on a computer. The procedure may vary among health care providers and hospitals. What happens after the procedure?  You may return to your normal, everyday life, including diet, activities, and medicines, unless your health care provider tells you not to do that. Summary  An echocardiogram is a procedure that uses painless sound waves (ultrasound) to produce an image of the heart.  Images from an echocardiogram can provide important information about the  size and shape of your heart, heart muscle function, heart valve function, and fluid buildup around your heart.  You do not need to do anything to prepare before this procedure. You may eat and drink normally.  After the echocardiogram is completed, you may return to your normal, everyday life, unless your health care provider tells you not to do that. This information is not intended to replace advice given to you by your health care provider. Make sure you discuss any questions you have with your health care provider. Document Revised: 02/23/2019 Document Reviewed: 12/05/2016 Elsevier Patient Education  2020 [place], Kate Sable, MD  05/02/2020 12:44 PM    [place]

## 2020-05-02 NOTE — Patient Instructions (Signed)
Medication Instructions:   Your physician has recommended you make the following change in your medication:   1. STOP taking your Simvastatin 2. START taking Lipitor 40mg , one tablet by mouth once a day.   *If you need a refill on your cardiac medications before your next appointment, please call your pharmacy*   Lab Work: Fasting Lipid Panel to be drawn in 3 months (week of 07/29/20)  - You will need to be fasting. Please do not have anything to eat or drink after midnight the morning you have the lab work. You may only have water or black coffee with no cream or sugar.  - Please go to the [facility]. You will check in at the front desk to the right as you walk into the atrium. Valet Parking is offered if needed. - No appointment needed. You may go any day between 7 am and 6 pm.  If you have any lab test that is abnormal or we need to change your treatment, we will call you to review the results.   Testing/Procedures: Your physician has requested that you have an echocardiogram. Echocardiography is a painless test that uses sound waves to create images of your heart. It provides your doctor with information about the size and shape of your heart and how well your heart's chambers and valves are working. This procedure takes approximately one hour. There are no restrictions for this procedure.   Follow-Up: At [facility], you and your health needs are our priority.  As part of our continuing mission to provide you with exceptional heart care, we have created designated Provider Care Teams.  These Care Teams include your primary Cardiologist (physician) and Advanced Practice Providers (APPs -  Physician Assistants and Nurse Practitioners) who all work together to provide you with the care you need, when you need it.  We recommend signing up for the patient portal called "MyChart".  Sign up information is provided on this After Visit Summary.  MyChart is used to connect with  patients for Virtual Visits (Telemedicine).  Patients are able to view lab/test results, encounter notes, upcoming appointments, etc.  Non-urgent messages can be sent to your provider as well.   To learn more about what you can do with MyChart, go to [email or web address].    Your next appointment:    3 months (week of 08/05/20)   The format for your next appointment:   In Person  Provider:   Kate Sable, MD   Other Instructions   Echocardiogram An echocardiogram is a procedure that uses painless sound waves (ultrasound) to produce an image of the heart. Images from an echocardiogram can provide important information about:  Signs of coronary artery disease (CAD).  Aneurysm detection. An aneurysm is a weak or damaged part of an artery wall that bulges out from the normal force of blood pumping through the body.  Heart size and shape. Changes in the size or shape of the heart can be associated with certain conditions, including heart failure, aneurysm, and CAD.  Heart muscle function.  Heart valve function.  Signs of a past heart attack.  Fluid buildup around the heart.  Thickening of the heart muscle.  A tumor or infectious growth around the heart valves. Tell a health care provider about:  Any allergies you have.  All medicines you are taking, including vitamins, herbs, eye drops, creams, and over-the-counter medicines.  Any blood disorders you have.  Any surgeries you have had.  Any medical conditions you have.  Whether you are pregnant or may be pregnant. What are the risks? Generally, this is a safe procedure. However, problems may occur, including:  Allergic reaction to dye (contrast) that may be used during the procedure. What happens before the procedure? No specific preparation is needed. You may eat and drink normally. What happens during the procedure?   An IV tube may be inserted into one of your veins.  You may receive contrast through  this tube. A contrast is an injection that improves the quality of the pictures from your heart.  A gel will be applied to your chest.  A wand-like tool (transducer) will be moved over your chest. The gel will help to transmit the sound waves from the transducer.  The sound waves will harmlessly bounce off of your heart to allow the heart images to be captured in real-time motion. The images will be recorded on a computer. The procedure may vary among health care providers and hospitals. What happens after the procedure?  You may return to your normal, everyday life, including diet, activities, and medicines, unless your health care provider tells you not to do that. Summary  An echocardiogram is a procedure that uses painless sound waves (ultrasound) to produce an image of the heart.  Images from an echocardiogram can provide important information about the size and shape of your heart, heart muscle function, heart valve function, and fluid buildup around your heart.  You do not need to do anything to prepare before this procedure. You may eat and drink normally.  After the echocardiogram is completed, you may return to your normal, everyday life, unless your health care provider tells you not to do that. This information is not intended to replace advice given to you by your health care provider. Make sure you discuss any questions you have with your health care provider. Document Revised: 02/23/2019 Document Reviewed: 12/05/2016 Elsevier Patient Education  [place].

## 2020-05-03 ENCOUNTER — Other Ambulatory Visit: Payer: Self-pay | Admitting: Family Medicine

## 2020-05-03 DIAGNOSIS — G8929 Other chronic pain: Secondary | ICD-10-CM

## 2020-05-03 DIAGNOSIS — M4726 Other spondylosis with radiculopathy, lumbar region: Secondary | ICD-10-CM

## 2020-05-03 DIAGNOSIS — I10 Essential (primary) hypertension: Secondary | ICD-10-CM

## 2020-05-03 MED ORDER — LOSARTAN POTASSIUM 100 MG PO TABS: 100.0000 mg | ORAL_TABLET | Freq: Every day | ORAL | 1 refills | Status: DC

## 2020-05-07 ENCOUNTER — Ambulatory Visit (INDEPENDENT_AMBULATORY_CARE_PROVIDER_SITE_OTHER): Payer: Medicare HMO | Admitting: Sports Medicine

## 2020-05-07 ENCOUNTER — Other Ambulatory Visit: Payer: Self-pay

## 2020-05-07 DIAGNOSIS — M25611 Stiffness of right shoulder, not elsewhere classified: Secondary | ICD-10-CM

## 2020-05-07 NOTE — Patient Instructions (Signed)
For your shoulder:  We think you likely have a suprascapular nerve injury vs. C5 disc herniation. This would be best evaluated by an EMG/Nerve conduction study to evaluate further. Please discuss this with your neurosurgeon when you see him this week.   For any further questions please call Dr. Micheline Chapman

## 2020-05-07 NOTE — Assessment & Plan Note (Signed)
Patient with decreased range of motion in right shoulder. Physical exam did note some deltoid atrophy. Also with weak deltoid, subscapularis, biceps. Does have good triceps strength. Normal reflexes that are equal. Given concern of continued weakness with active range of motion but normal passive range of motion it is likely a nerve issue. Consider suprascapular nerve injury versus C5 disc herniation. Will need nerve conduction study to differentiate. Patient has a neurosurgery appointment coming up this week so advised to discuss with neurosurgeon. Once we are able to differentiate between the 2 diagnoses we can determine a treatment plan.

## 2020-05-07 NOTE — Progress Notes (Signed)
Robin Lang is a 69 y.o. female who presents to [facility] today for the following:  Inability to move Right shoulder  Patient presenting for right shoulder pain. States that she initially started to feel pain on June 1, remembers the date because it was the day she was returning from vacation. States that she was flying back from vacation when she noticed the pain. Initially thought she slept wrong and thought it would go away. It continued on so she had a massage done in mid June. 2 days after massage she felt like she could not lift her arm. Denies any trauma to the area. Denies any history of previous trauma or injury to the right shoulder. Denies any numbness, tingling, weakness. Saw her cardiologist because she thought she was having a stroke, they did an EKG and told her it was within normal limits and she was not having a stroke. Of note she is right-handed.  PMH reviewed. HTN, osteoarthritis of spine, chronic low back pain with sciatica, spinal stenosis of lumbar spine ROS as above. Medications reviewed.  Exam:  BP 140/72   Ht 5\' 3"  (1.6 m)   Wt 141 lb (64 kg)   BMI 24.98 kg/m  Gen: Well NAD MSK: Shoulder: Inspection reveals deltoid atrophy of right shoulder. No swelling Palpation is normal with no TTP over [facility] joint or bicipital groove. Limited ROM in flexion, abduction, internal/external rotation NV intact distally Normal scapular function observed. Special Tests:  - Impingement: Neg Hawkins, neers, empty can sign. Unable to fully test empty can 2/2 weakness  - Supraspinatous: Negative empty can.  Decreased strength with resisted flexion at 20 degrees - Infraspinatous/Teres Minor: decreased strength with ER - Subscapularis: decreased strength with IR - Biceps tendon: Negative Speeds, Yerrgason's  - Labrum: Negative Obriens but does have weakness  - AC Joint: Negative cross arm - Negative apprehension test - No painful arc and no drop arm sign - weak deltoid, subscapularis,  biceps. Good triceps strength  - 1+ bicep reflex but equivalent to left - 2+ triceps and brachioradialis    Assessment and Plan: 1) Decreased ROM of right shoulder Patient with decreased range of motion in right shoulder. Physical exam did note some deltoid atrophy. Also with weak deltoid, subscapularis, biceps. Does have good triceps strength. Normal reflexes that are equal. Given concern of continued weakness with active range of motion but normal passive range of motion it is likely a nerve issue. Consider suprascapular nerve injury versus C5 disc herniation. Will need nerve conduction study to differentiate. Patient has a neurosurgery appointment coming up this week so advised to discuss with neurosurgeon. Once we are able to differentiate between the 2 diagnoses we can determine a treatment plan.   Dalphine Handing, PGY-3 [facility] Family Medicine Resident  05/07/2020 3:41 PM   Patient seen and evaluated with the resident.  I agree with the above plan of care.  Patient has significant weakness with resisted supraspinatus, infraspinatus, and deltoid.  Also some weakness with resisted biceps on the right.  Differential includes suprascapular nerve injury versus cervical disc herniation.  I discussed getting an EMG/nerve conduction study.  She has an appointment with neurosurgery later this week and was asking if they may be able to get the test.  I provided her with information about the reason for the EMG to provide to her neurosurgeon and if they are unable to order the test she will let me know.  I did explain the importance of getting this test  done with her significant weakness.  She understands.

## 2020-05-09 DIAGNOSIS — M48062 Spinal stenosis, lumbar region with neurogenic claudication: Secondary | ICD-10-CM | POA: Diagnosis not present

## 2020-05-14 MED ORDER — HYDROCODONE-ACETAMINOPHEN 5-325 MG PO TABS: 1.0000 | ORAL_TABLET | ORAL | 0 refills | Status: DC | PRN

## 2020-05-14 NOTE — Addendum Note (Signed)
Addended by: Olin Hauser on: 05/14/2020 12:45 PM   Modules accepted: Orders

## 2020-05-16 DIAGNOSIS — M5416 Radiculopathy, lumbar region: Secondary | ICD-10-CM | POA: Diagnosis not present

## 2020-05-27 ENCOUNTER — Ambulatory Visit: Payer: Self-pay | Admitting: *Deleted

## 2020-05-27 ENCOUNTER — Other Ambulatory Visit: Payer: Self-pay

## 2020-05-27 ENCOUNTER — Ambulatory Visit
Admission: RE | Admit: 2020-05-27 | Discharge: 2020-05-27 | Disposition: A | Discharge status: Home / Self Care | Payer: Medicare HMO | Attending: Family Medicine | Admitting: Family Medicine

## 2020-05-27 ENCOUNTER — Ambulatory Visit
Admission: RE | Admit: 2020-05-27 | Discharge: 2020-05-27 | Disposition: A | Discharge status: Home / Self Care | Payer: Medicare HMO | Source: Ambulatory Visit | Attending: Family Medicine | Admitting: Family Medicine

## 2020-05-27 ENCOUNTER — Ambulatory Visit (INDEPENDENT_AMBULATORY_CARE_PROVIDER_SITE_OTHER): Payer: Medicare HMO | Admitting: Family Medicine

## 2020-05-27 ENCOUNTER — Encounter: Payer: Self-pay | Admitting: Family Medicine

## 2020-05-27 VITALS — BP 139/56 | HR 63 | Ht 63.0 in | Wt 148.2 lb

## 2020-05-27 DIAGNOSIS — S59911A Unspecified injury of right forearm, initial encounter: Secondary | ICD-10-CM | POA: Diagnosis not present

## 2020-05-27 DIAGNOSIS — S6991XA Unspecified injury of right wrist, hand and finger(s), initial encounter: Secondary | ICD-10-CM | POA: Diagnosis not present

## 2020-05-27 DIAGNOSIS — M79601 Pain in right arm: Secondary | ICD-10-CM | POA: Diagnosis not present

## 2020-05-27 DIAGNOSIS — T148XXA Other injury of unspecified body region, initial encounter: Secondary | ICD-10-CM | POA: Diagnosis not present

## 2020-05-27 DIAGNOSIS — S4991XA Unspecified injury of right shoulder and upper arm, initial encounter: Secondary | ICD-10-CM | POA: Insufficient documentation

## 2020-05-27 NOTE — Patient Instructions (Signed)
Have your labs drawn and we will contact you once we receive the results.  As we discussed, no acute fracture or avulsion of the right forearm or right wrist.  Can continue to use ACE wrap as needed for comfort.  Can rest, ice, and elevate as needed for any discomfort.  We will plan to see you back if symptoms worsen or fail to improve  You will receive a survey after today's visit either digitally by e-mail or paper by USPS mail. Your experiences and feedback matter to [place].  Please respond so we know how we are doing as we provide care for you.  Call us with any questions/concerns/needs.  It is my goal to be available to you for your health concerns.  Thanks for choosing me to be a partner in your healthcare needs!  Harlin Rain, FNP-C Family Nurse Practitioner [facility] Phone: [phone]

## 2020-05-27 NOTE — Assessment & Plan Note (Signed)
Right arm injury since Saturday.  XR of right forearm and right wrist taken.  No acute fractures or avulsions.  Discussed with patient, symptom management, can rest/ice/elevate as needed for symptoms.  No need for brace, but can ACE wrap for comfort.  Patient with concerns for increased bruising, will have labs drawn for evaluation of platelets and PT/PTT INR.  Plan: 1. Labs to be drawn for CBC, PT/PTT, INR due to increased bruising 2. RTC PRN

## 2020-05-27 NOTE — Telephone Encounter (Signed)
Patient has apt scheduled with Elmyra Ricks this afternoon.

## 2020-05-27 NOTE — Telephone Encounter (Signed)
  Patient is calling to report she was hit by a falling limb on Saturday. Patient has pain and bruising- R arm and pain in R upper side.  Attempted to call office- all lines busy- will send message for scheduling- could not schedule appointment OV/SD- even though it appeared to have open slots. Reason for Disposition . [1] High-risk adult (e.g., age > 21 years, osteoporosis, chronic steroid use) AND [2] still hurts  Answer Assessment - Initial Assessment Questions 1. MECHANISM: "How did the injury happen?"     Tree branch fell on her- R forearm and side 2. ONSET: "When did the injury happen?" (Minutes or hours ago)      Saturday 3. LOCATION: "Where is the injury located?" "Which arm?"     R forearm and side 4. APPEARANCE of INJURY: "What does the injury look like?"      Bruising- discoloration- arm 5. SEVERITY: "Can you use the arm normally?"      yes 6. SWELLING or BRUISING: "is there any swelling or bruising?" If Yes, ask: "How large is it? (e.g., inches, centimeters)      Above the wrist- knot on side of forearm 7. PAIN: "Is there pain?" If Yes, ask: "How bad is the pain?"    (Scale 1-10; or mild, moderate, severe)   - NONE (0): no pain.   - MILD (1-3): doesn't interfere with normal activities   - MODERATE (4-7): interferes with normal activities (e.g., work or school) or awakens from sleep   - SEVERE (8-10): excruciating pain, unable to do any normal activities, unable to hold a cup of water     moderate 8. TETANUS: For any breaks in the skin, ask: "When was the last tetanus booster?"     Up to date 9. OTHER SYMPTOMS: "Do you have any other symptoms?"  (e.g., numbness in hand)     no 10. PREGNANCY: "Is there any chance you are pregnant?" "When was your last menstrual period?"       n/a  Protocols used: ARM INJURY-A-AH

## 2020-05-27 NOTE — Progress Notes (Signed)
Subjective:    Patient ID: Robin Lang, female    DOB: 1951/08/26, 69 y.o.   MRN: [id number]  William Laske is a 69 y.o. female presenting on 05/27/2020 for Arm Injury (She injured her arm doing yardwork. Helping a friend who was cutting  down limbs in a tree and a limb fell down and hit her in the Rt arm and right ches tarea. Pt complains right arm pain, mostly on the wrist area. Right wrist brusied with swelling. x 2 days )   HPI  Ms. Hashimi presents to clinic for evaluation of right arm injury since Saturday.  Reports she injured her arm doing yardwork, was helping a friend cut down tree limbs, when a limb fell down and hit her in the right chest and right forearm & wrist.  Has bruising and swelling to the right forearm and wrist.  Denies loss of ROM, numbness, tingling, weakness, decreased strength.  Depression screen [facility] 2/9 04/23/2020 12/11/2019 08/23/2019  Decreased Interest 0 0 0  Down, Depressed, Hopeless 0 0 0  PHQ - 2 Score 0 0 0    Social History   Tobacco Use  . Smoking status: Former Smoker    Packs/day: 0.50    Years: 9.00    Pack years: 4.50    Quit date: 1989    Years since quitting: 32.5  . Smokeless tobacco: Never Used  Vaping Use  . Vaping Use: Never used  Substance Use Topics  . Alcohol use: Yes    Alcohol/week: 2.0 standard drinks    Types: 2 Glasses of wine per week    Comment: occ  . Drug use: Never    Review of Systems  Constitutional: Negative.   HENT: Negative.   Eyes: Negative.   Respiratory: Negative.   Cardiovascular: Negative.   Gastrointestinal: Negative.   Endocrine: Negative.   Genitourinary: Negative.   Musculoskeletal: Positive for myalgias. Negative for arthralgias, back pain, gait problem, joint swelling, neck pain and neck stiffness.  Skin: Positive for color change.  Allergic/Immunologic: Negative.   Neurological: Negative.   Hematological: Negative for adenopathy. Bruises/bleeds easily.  Psychiatric/Behavioral: Negative.     Per HPI unless specifically indicated above     Objective:    BP (!) 139/56 (BP Location: Left Arm, Patient Position: Sitting, Cuff Size: Normal)   Pulse 63   Ht 5\' 3"  (1.6 m)   Wt 148 lb 3.2 oz (67.2 kg)   BMI 26.25 kg/m   Wt Readings from Last 3 Encounters:  05/27/20 148 lb 3.2 oz (67.2 kg)  05/07/20 141 lb (64 kg)  05/02/20 141 lb 6 oz (64.1 kg)    Physical Exam Vitals reviewed.  Constitutional:      General: She is not in acute distress.    Appearance: Normal appearance. She is well-developed, well-groomed and overweight. She is not ill-appearing or toxic-appearing.  HENT:     Head: Normocephalic and atraumatic.     Nose:     Comments: [name] is in place, covering mouth and nose. Eyes:     General: Lids are normal. Vision grossly intact.        Right eye: No discharge.        Left eye: No discharge.     Extraocular Movements: Extraocular movements intact.     Conjunctiva/sclera: Conjunctivae normal.     Pupils: Pupils are equal, round, and reactive to light.  Cardiovascular:     Rate and Rhythm: Normal rate and regular rhythm.     Pulses: Normal  pulses.          Dorsalis pedis pulses are 2+ on the right side and 2+ on the left side.     Heart sounds: Normal heart sounds. No murmur heard.  No friction rub. No gallop.   Pulmonary:     Effort: Pulmonary effort is normal. No respiratory distress.     Breath sounds: Normal breath sounds.  Musculoskeletal:     Right forearm: Swelling present. No edema, lacerations or tenderness.     Left forearm: Normal.     Right wrist: Swelling and laceration present. No deformity, effusion, tenderness or snuff box tenderness. Normal range of motion. Normal pulse.     Left wrist: Normal.     Right lower leg: No edema.     Left lower leg: No edema.     Comments: Multiple ecchymotic areas on right distal forearm with swelling x 2, likely area of direct impact  Skin:    General: Skin is warm and dry.     Capillary Refill:  Capillary refill takes less than 2 seconds.     Findings: Bruising present.  Neurological:     General: No focal deficit present.     Mental Status: She is alert and oriented to person, place, and time.  Psychiatric:        Attention and Perception: Attention and perception normal.        Mood and Affect: Mood and affect normal.        Speech: Speech normal.        Behavior: Behavior normal. Behavior is cooperative.        Thought Content: Thought content normal.        Cognition and Memory: Cognition and memory normal.        Judgment: Judgment normal.    Results for orders placed or performed in visit on 03/07/20  Sedimentation rate  Result Value Ref Range   Sed Rate 2 0 - 40 mm/hr  C-reactive protein  Result Value Ref Range   CRP 15 (H) 0 - 10 mg/L  Antinuclear Antib (ANA)  Result Value Ref Range   Anti Nuclear Antibody (ANA) Negative Negative  Rheumatoid Factor  Result Value Ref Range   Rhuematoid fact SerPl-aCnc <10.0 0.0 - 84.1 IU/mL  CYCLIC CITRUL PEPTIDE ANTIBODY, IGG/IGA  Result Value Ref Range   Cyclic Citrullin Peptide Ab 6 0 - 19 units      Assessment & Plan:   Problem List Items Addressed This Visit      Other   Arm injury, right, initial encounter    Right arm injury since Saturday.  XR of right forearm and right wrist taken.  No acute fractures or avulsions.  Discussed with patient, symptom management, can rest/ice/elevate as needed for symptoms.  No need for brace, but can ACE wrap for comfort.  Patient with concerns for increased bruising, will have labs drawn for evaluation of platelets and PT/PTT INR.  Plan: 1. Labs to be drawn for CBC, PT/PTT, INR due to increased bruising 2. RTC PRN       Other Visit Diagnoses    Right arm pain    -  Primary   Relevant Orders   DG Forearm Right (Completed)   DG Wrist Complete Right (Completed)   Bruising       Relevant Orders   CBC with Differential   Protime-INR   APTT      No orders of the defined  types were placed in this encounter.  Follow up plan: Return if symptoms worsen or fail to improve.   Harlin Rain, [place] Family Nurse Practitioner [facility] 05/27/2020, 3:09 PM

## 2020-05-28 DIAGNOSIS — M5416 Radiculopathy, lumbar region: Secondary | ICD-10-CM | POA: Diagnosis not present

## 2020-05-28 DIAGNOSIS — M79605 Pain in left leg: Secondary | ICD-10-CM | POA: Diagnosis not present

## 2020-05-28 DIAGNOSIS — M48061 Spinal stenosis, lumbar region without neurogenic claudication: Secondary | ICD-10-CM | POA: Diagnosis not present

## 2020-05-28 LAB — CBC WITH DIFFERENTIAL/PLATELET
Basophils Absolute: 0 10*3/uL (ref 0.0–0.2)
Basos: 0 %
EOS (ABSOLUTE): 0.1 10*3/uL (ref 0.0–0.4)
Eos: 1 %
Hematocrit: 36.4 % (ref 34.0–46.6)
Hemoglobin: 11.9 g/dL (ref 11.1–15.9)
Immature Grans (Abs): 0 10*3/uL (ref 0.0–0.1)
Immature Granulocytes: 0 %
Lymphocytes Absolute: 1.5 10*3/uL (ref 0.7–3.1)
Lymphs: 21 %
MCH: 30.1 pg (ref 26.6–33.0)
MCHC: 32.7 g/dL (ref 31.5–35.7)
MCV: 92 fL (ref 79–97)
Monocytes Absolute: 0.8 10*3/uL (ref 0.1–0.9)
Monocytes: 11 %
Neutrophils Absolute: 4.8 10*3/uL (ref 1.4–7.0)
Neutrophils: 67 %
Platelets: 231 10*3/uL (ref 150–450)
RBC: 3.95 x10E6/uL (ref 3.77–5.28)
RDW: 13.1 % (ref 11.7–15.4)
WBC: 7.3 10*3/uL (ref 3.4–10.8)

## 2020-05-28 LAB — PROTIME-INR
INR: 0.9 (ref 0.9–1.2)
Prothrombin Time: 9.8 s (ref 9.1–12.0)

## 2020-05-28 LAB — APTT: aPTT: 23 s — ABNORMAL LOW (ref 24–33)

## 2020-05-29 ENCOUNTER — Other Ambulatory Visit: Payer: Self-pay

## 2020-05-29 DIAGNOSIS — M25611 Stiffness of right shoulder, not elsewhere classified: Secondary | ICD-10-CM

## 2020-06-05 ENCOUNTER — Ambulatory Visit: Payer: Medicare HMO | Admitting: Dermatology

## 2020-06-06 ENCOUNTER — Other Ambulatory Visit: Payer: Self-pay

## 2020-06-06 ENCOUNTER — Ambulatory Visit (INDEPENDENT_AMBULATORY_CARE_PROVIDER_SITE_OTHER): Payer: Medicare HMO

## 2020-06-06 DIAGNOSIS — E7800 Pure hypercholesterolemia, unspecified: Secondary | ICD-10-CM | POA: Diagnosis not present

## 2020-06-06 DIAGNOSIS — R011 Cardiac murmur, unspecified: Secondary | ICD-10-CM | POA: Diagnosis not present

## 2020-06-06 DIAGNOSIS — I10 Essential (primary) hypertension: Secondary | ICD-10-CM

## 2020-06-06 DIAGNOSIS — M48061 Spinal stenosis, lumbar region without neurogenic claudication: Secondary | ICD-10-CM | POA: Diagnosis not present

## 2020-06-06 DIAGNOSIS — I739 Peripheral vascular disease, unspecified: Secondary | ICD-10-CM | POA: Diagnosis not present

## 2020-06-06 LAB — ECHOCARDIOGRAM COMPLETE
AR max vel: 2.8 cm2
AV Area VTI: 2.88 cm2
AV Area mean vel: 2.84 cm2
AV Mean grad: 8 mmHg
AV Peak grad: 14.9 mmHg
Ao pk vel: 1.93 m/s
Area-P 1/2: 3.66 cm2
Calc EF: 78.1 %
S' Lateral: 2.4 cm
Single Plane A2C EF: 79.3 %
Single Plane A4C EF: 77.5 %

## 2020-06-07 DIAGNOSIS — M5416 Radiculopathy, lumbar region: Secondary | ICD-10-CM | POA: Diagnosis not present

## 2020-06-12 ENCOUNTER — Other Ambulatory Visit: Payer: Self-pay | Admitting: Family Medicine

## 2020-06-12 DIAGNOSIS — I10 Essential (primary) hypertension: Secondary | ICD-10-CM

## 2020-06-13 ENCOUNTER — Other Ambulatory Visit: Payer: Self-pay | Admitting: Family Medicine

## 2020-06-13 DIAGNOSIS — I10 Essential (primary) hypertension: Secondary | ICD-10-CM

## 2020-06-19 DIAGNOSIS — M25561 Pain in right knee: Secondary | ICD-10-CM | POA: Diagnosis not present

## 2020-06-19 DIAGNOSIS — M170 Bilateral primary osteoarthritis of knee: Secondary | ICD-10-CM | POA: Diagnosis not present

## 2020-06-19 DIAGNOSIS — M25562 Pain in left knee: Secondary | ICD-10-CM | POA: Diagnosis not present

## 2020-06-20 DIAGNOSIS — M48061 Spinal stenosis, lumbar region without neurogenic claudication: Secondary | ICD-10-CM | POA: Diagnosis not present

## 2020-06-27 DIAGNOSIS — M48062 Spinal stenosis, lumbar region with neurogenic claudication: Secondary | ICD-10-CM | POA: Diagnosis not present

## 2020-06-27 DIAGNOSIS — M48061 Spinal stenosis, lumbar region without neurogenic claudication: Secondary | ICD-10-CM | POA: Diagnosis not present

## 2020-07-08 ENCOUNTER — Telehealth: Payer: Self-pay | Admitting: Family Medicine

## 2020-07-08 NOTE — Telephone Encounter (Signed)
Patient is calling to schedule her AWV Patient is interested in a telephone call or video visit. Please advise CB- [phone]

## 2020-07-16 ENCOUNTER — Ambulatory Visit: Payer: Medicare HMO

## 2020-07-16 ENCOUNTER — Telehealth: Payer: Self-pay

## 2020-07-16 DIAGNOSIS — G8929 Other chronic pain: Secondary | ICD-10-CM | POA: Diagnosis not present

## 2020-07-16 DIAGNOSIS — M25561 Pain in right knee: Secondary | ICD-10-CM | POA: Diagnosis not present

## 2020-07-16 DIAGNOSIS — M1732 Unilateral post-traumatic osteoarthritis, left knee: Secondary | ICD-10-CM | POA: Diagnosis not present

## 2020-07-16 DIAGNOSIS — M1731 Unilateral post-traumatic osteoarthritis, right knee: Secondary | ICD-10-CM | POA: Diagnosis not present

## 2020-07-16 DIAGNOSIS — M7121 Synovial cyst of popliteal space [Baker], right knee: Secondary | ICD-10-CM | POA: Diagnosis not present

## 2020-07-16 DIAGNOSIS — M25562 Pain in left knee: Secondary | ICD-10-CM | POA: Diagnosis not present

## 2020-07-16 NOTE — Telephone Encounter (Signed)
This nurse called patient in order to perform scheduled AWV. Patient decided that she is not interested in participating in the visit.

## 2020-07-29 ENCOUNTER — Other Ambulatory Visit: Payer: Self-pay | Admitting: Family Medicine

## 2020-07-29 ENCOUNTER — Other Ambulatory Visit: Payer: Self-pay | Admitting: Cardiology

## 2020-07-29 DIAGNOSIS — I10 Essential (primary) hypertension: Secondary | ICD-10-CM

## 2020-07-29 DIAGNOSIS — Z7689 Persons encountering health services in other specified circumstances: Secondary | ICD-10-CM | POA: Diagnosis not present

## 2020-07-30 ENCOUNTER — Ambulatory Visit (INDEPENDENT_AMBULATORY_CARE_PROVIDER_SITE_OTHER): Payer: Medicare HMO | Admitting: Family Medicine

## 2020-07-30 ENCOUNTER — Encounter: Payer: Self-pay | Admitting: Family Medicine

## 2020-07-30 ENCOUNTER — Other Ambulatory Visit: Payer: Self-pay

## 2020-07-30 VITALS — BP 114/51 | HR 59 | Temp 98.4°F | Resp 18 | Ht 63.0 in | Wt 146.6 lb

## 2020-07-30 DIAGNOSIS — L089 Local infection of the skin and subcutaneous tissue, unspecified: Secondary | ICD-10-CM | POA: Diagnosis not present

## 2020-07-30 LAB — LIPID PANEL
Chol/HDL Ratio: 2.1 ratio (ref 0.0–4.4)
Cholesterol, Total: 183 mg/dL (ref 100–199)
HDL: 89 mg/dL (ref >39)
LDL Chol Calc (NIH): 81 mg/dL (ref 0–99)
Triglycerides: 69 mg/dL (ref 0–149)
VLDL Cholesterol Cal: 13 mg/dL (ref 5–40)

## 2020-07-30 MED ORDER — SULFAMETHOXAZOLE-TRIMETHOPRIM 800-160 MG PO TABS: 1.0000 | ORAL_TABLET | Freq: Two times a day (BID) | ORAL | 0 refills | Status: AC

## 2020-07-30 NOTE — Progress Notes (Signed)
Subjective:    Patient ID: Robin Lang, female    DOB: 04/24/1951, 69 y.o.   MRN: [id number]  Robin Lang is a 69 y.o. female presenting on 07/30/2020 for Abrasion (pt state her dog jump up on her and scratched her right forearm x 1.5 weeks ago.  Erythema, itching with a scab.)   HPI  Robin Lang presents to clinic for concerns of right forearm scratch that she believes is infected.  Reports this happened approximately 1.5 weeks ago, had put bacitracin on the wound when it happened before she realized that she was allergic to it.  Denies any fevers, drainage from wound, streaking or pain.  Depression screen [facility] 2/9 04/23/2020 12/11/2019 08/23/2019  Decreased Interest 0 0 0  Down, Depressed, Hopeless 0 0 0  PHQ - 2 Score 0 0 0    Social History   Tobacco Use  . Smoking status: Former Smoker    Packs/day: 0.50    Years: 9.00    Pack years: 4.50    Quit date: 1989    Years since quitting: 32.7  . Smokeless tobacco: Never Used  Vaping Use  . Vaping Use: Never used  Substance Use Topics  . Alcohol use: Yes    Alcohol/week: 2.0 standard drinks    Types: 2 Glasses of wine per week    Comment: occ  . Drug use: Never    Review of Systems  Constitutional: Negative.   HENT: Negative.   Eyes: Negative.   Respiratory: Negative.   Cardiovascular: Negative.   Gastrointestinal: Negative.   Endocrine: Negative.   Genitourinary: Negative.   Musculoskeletal: Negative.   Skin: Positive for wound. Negative for color change, pallor and rash.  Allergic/Immunologic: Negative.   Neurological: Negative.   Hematological: Negative.   Psychiatric/Behavioral: Negative.    Per HPI unless specifically indicated above     Objective:    BP (!) 114/51 (BP Location: Left Arm, Patient Position: Sitting, Cuff Size: Normal)   Pulse (!) 59   Temp 98.4 F (36.9 C) (Oral)   Resp 18   Ht 5\' 3"  (1.6 m)   Wt 146 lb 9.6 oz (66.5 kg)   SpO2 100%   BMI 25.97 kg/m   Wt Readings from Last 3  Encounters:  07/30/20 146 lb 9.6 oz (66.5 kg)  05/27/20 148 lb 3.2 oz (67.2 kg)  05/07/20 141 lb (64 kg)    Physical Exam Vitals reviewed.  Constitutional:      General: She is not in acute distress.    Appearance: Normal appearance. She is well-developed, well-groomed and overweight. She is not ill-appearing or toxic-appearing.  HENT:     Head: Normocephalic and atraumatic.     Nose:     Comments: [name] is in place, covering mouth and nose. Eyes:     General: Lids are normal. Vision grossly intact.        Right eye: No discharge.        Left eye: No discharge.     Extraocular Movements: Extraocular movements intact.     Conjunctiva/sclera: Conjunctivae normal.     Pupils: Pupils are equal, round, and reactive to light.  Cardiovascular:     Pulses: Normal pulses.          Dorsalis pedis pulses are 2+ on the right side and 2+ on the left side.  Pulmonary:     Effort: Pulmonary effort is normal. No respiratory distress.  Musculoskeletal:     Right lower leg: No edema.  Left lower leg: No edema.  Skin:    General: Skin is warm and dry.     Capillary Refill: Capillary refill takes less than 2 seconds.       Neurological:     General: No focal deficit present.     Mental Status: She is alert and oriented to person, place, and time.  Psychiatric:        Attention and Perception: Attention and perception normal.        Mood and Affect: Mood and affect normal.        Speech: Speech normal.        Behavior: Behavior normal. Behavior is cooperative.        Thought Content: Thought content normal.        Cognition and Memory: Cognition and memory normal.        Judgment: Judgment normal.    Results for orders placed or performed in visit on 06/06/20  ECHOCARDIOGRAM COMPLETE  Result Value Ref Range   AR max vel 2.80 cm2   AV Peak grad 14.9 mmHg   Ao pk vel 1.93 m/s   S' Lateral 2.40 cm   Area-P 1/2 3.66 cm2   AV Area VTI 2.88 cm2   AV Mean grad 8.0 mmHg   Single  Plane A4C EF 77.5 %   Single Plane A2C EF 79.3 %   Calc EF 78.1 %   AV Area mean vel 2.84 cm2      Assessment & Plan:   Problem List Items Addressed This Visit      Musculoskeletal and Integument   Skin infection - Primary    Skin infection, distal anterior forearm with 2 areas of scabbing with redness around wound.  Will treat with Bactrim DS 1 tablet 2x per day for the next 5 days.  Discussed avoiding bacitracin, as is allergic and trying to use saline wound wash if does not have access to soap and water next time has a cut/wound.  Plan: 1. Begin bactrim DS 1 tablet 2x per day for the next 5 days 2. RTC if symptoms worsen or fail to improve      Relevant Medications   sulfamethoxazole-trimethoprim (BACTRIM DS) 800-160 MG tablet      Meds ordered this encounter  Medications  . sulfamethoxazole-trimethoprim (BACTRIM DS) 800-160 MG tablet    Sig: Take 1 tablet by mouth 2 (two) times daily for 5 days.    Dispense:  10 tablet    Refill:  0    Follow up plan: Return if symptoms worsen or fail to improve.   Harlin Rain, [place] [facility] Nurse Practitioner [facility] 07/30/2020, 3:23 PM

## 2020-07-30 NOTE — Patient Instructions (Signed)
I have sent in a prescription for bactrim DS to take 1 tablet 2x per day for the next 5 days.  I would encourage you to pick up some saline wound wash, this is available over the counter at many pharmacies.  Can use soap and water (....or saline wound wash if do not have access to soap/water at time of cut) to help clean the wound in the future.  We will plan to see you back if your symptoms worsen or fail to improve  You will receive a survey after today's visit either digitally by e-mail or paper by USPS mail. Your experiences and feedback matter to [place].  Please respond so we know how we are doing as we provide care for you.  Call us with any questions/concerns/needs.  It is my goal to be available to you for your health concerns.  Thanks for choosing me to be a partner in your healthcare needs!  Harlin Rain, FNP-C Family Nurse Practitioner [facility] Phone: [phone]

## 2020-07-30 NOTE — Assessment & Plan Note (Signed)
Skin infection, distal anterior forearm with 2 areas of scabbing with redness around wound.  Will treat with Bactrim DS 1 tablet 2x per day for the next 5 days.  Discussed avoiding bacitracin, as is allergic and trying to use saline wound wash if does not have access to soap and water next time has a cut/wound.  Plan: 1. Begin bactrim DS 1 tablet 2x per day for the next 5 days 2. RTC if symptoms worsen or fail to improve

## 2020-08-02 ENCOUNTER — Other Ambulatory Visit: Payer: Self-pay

## 2020-08-02 ENCOUNTER — Ambulatory Visit: Payer: Medicare HMO | Admitting: Cardiology

## 2020-08-02 ENCOUNTER — Encounter: Payer: Self-pay | Admitting: Cardiology

## 2020-08-02 VITALS — BP 162/78 | HR 72 | Ht 63.0 in | Wt 145.0 lb

## 2020-08-02 DIAGNOSIS — I10 Essential (primary) hypertension: Secondary | ICD-10-CM

## 2020-08-02 DIAGNOSIS — I739 Peripheral vascular disease, unspecified: Secondary | ICD-10-CM | POA: Diagnosis not present

## 2020-08-02 DIAGNOSIS — R011 Cardiac murmur, unspecified: Secondary | ICD-10-CM

## 2020-08-02 DIAGNOSIS — E7800 Pure hypercholesterolemia, unspecified: Secondary | ICD-10-CM | POA: Diagnosis not present

## 2020-08-02 MED ORDER — ASPIRIN EC 81 MG PO TBEC
81.0000 mg | DELAYED_RELEASE_TABLET | Freq: Every day | ORAL | 3 refills | Status: DC
Start: 2020-08-02 — End: 2020-08-27

## 2020-08-02 NOTE — Patient Instructions (Signed)

## 2020-08-02 NOTE — Progress Notes (Signed)
Cardiology Office Note:    Date:  08/02/2020   ID:  Robin Lang, DOB 1951-07-04, MRN [id number]  PCP:  Olin Hauser, DO  [place] Cardiologist:  Kate Sable, MD  [place] Electrophysiologist:  None   Referring MD: Nobie Putnam *   Chief Complaint  Patient presents with  . Follow-up    Follow up for Echo, review results. Medications verbally reviewed with patient.     History of Present Illness:    Robin Lang is a 69 y.o. female with a hx of hypertension, hyperlipidemia, former smoker x15 years, DDD of lower back causing sciatica, who presents for follow-up.  Patient last seen due to hyperlipidemia and to establish care.  Systolic murmur noted on last exam, echo ordered to evaluate cardiac function.  Started on Lipitor for hyperlipidemia after last visit.  She obtain fasting lipid profile on Monday via [facility].  Prior notes Patient also noted to have atherosclerotic disease of the peripheral arteries on MRI to evaluate sciatica.  She recently slept poorly and had right shoulder pain.  Past Medical History:  Diagnosis Date  . Baker's cyst of knee, right 2015  . Diverticulosis   . Hyperlipidemia   . Hypertension   . Osteopenia     Past Surgical History:  Procedure Laterality Date  . CATARACT EXTRACTION Right 2017  . MENISCUS REPAIR Left 2012    Current Medications: Current Meds  Medication Sig  . acetaminophen (TYLENOL) 650 MG CR tablet Take by mouth.  Marland Kitchen atorvastatin (LIPITOR) 40 MG tablet Take 1 tablet (40 mg total) by mouth daily.  . diclofenac Sodium (VOLTAREN) 1 % GEL Apply topically 3 (three) times daily as needed.  Marland Kitchen losartan (COZAAR) 100 MG tablet Take 1 tablet by mouth once daily  . sulfamethoxazole-trimethoprim (BACTRIM DS) 800-160 MG tablet Take 1 tablet by mouth 2 (two) times daily for 5 days.  Marland Kitchen triamterene-hydrochlorothiazide (MAXZIDE) 75-50 MG tablet Take 1/2 (one-half) tablet by mouth once daily  . verapamil  (CALAN) 120 MG tablet Take 1 tablet by mouth twice daily     Allergies:   Latex and Neosporin [neomycin-bacitracin zn-polymyx]   Social History   Socioeconomic History  . Marital status: Divorced    Spouse name: Not on file  . Number of children: 2  . Years of education: Not on file  . Highest education level: Some college, no degree  Occupational History  . Not on file  Tobacco Use  . Smoking status: Former Smoker    Packs/day: 0.50    Years: 9.00    Pack years: 4.50    Quit date: 1989    Years since quitting: 32.7  . Smokeless tobacco: Never Used  Vaping Use  . Vaping Use: Never used  Substance and Sexual Activity  . Alcohol use: Yes    Alcohol/week: 2.0 standard drinks    Types: 2 Glasses of wine per week    Comment: occ  . Drug use: Never  . Sexual activity: Not Currently  Other Topics Concern  . Not on file  Social History Narrative  . Not on file   Social Determinants of Health   Financial Resource Strain:   . Difficulty of Paying Living Expenses: Not on file  Food Insecurity:   . Worried About Charity fundraiser in the Last Year: Not on file  . Ran Out of Food in the Last Year: Not on file  Transportation Needs:   . Lack of Transportation (Medical): Not on file  . Lack  of Transportation (Non-Medical): Not on file  Physical Activity:   . Days of Exercise per Week: Not on file  . Minutes of Exercise per Session: Not on file  Stress:   . Feeling of Stress : Not on file  Social Connections:   . Frequency of Communication with Friends and Family: Not on file  . Frequency of Social Gatherings with Friends and Family: Not on file  . Attends Religious Services: Not on file  . Active Member of Clubs or Organizations: Not on file  . Attends Archivist Meetings: Not on file  . Marital Status: Not on file     Family History: The patient's family history includes Heart attack in her father; Heart disease in her father and sister; Lung cancer in her  sister; Multiple sclerosis in her brother; Renal cancer in her mother; Stroke in her mother; Thyroid cancer in her brother. There is no history of Breast cancer.  ROS:   Please see the history of present illness.     All other systems reviewed and are negative.  EKGs/Labs/Other Studies Reviewed:    The following studies were reviewed today:   EKG:  EKG not ordered today.    Recent Labs: 12/11/2019: ALT 18; BUN 20; Creatinine, Ser 1.19; Potassium 3.9; Sodium 143 05/27/2020: Hemoglobin 11.9; Platelets 231  Recent Lipid Panel    Component Value Date/Time   CHOL 223 (H) 08/24/2019 0832   TRIG 88 08/24/2019 0832   HDL 101 08/24/2019 0832   CHOLHDL 2.2 08/24/2019 0832   LDLCALC 107 (H) 08/24/2019 0832    Physical Exam:    VS:  BP (!) 162/78 (BP Location: Left Arm, Patient Position: Sitting, Cuff Size: Normal)   Pulse 72   Ht 5\' 3"  (1.6 m)   Wt 145 lb (65.8 kg)   SpO2 97%   BMI 25.69 kg/m     Wt Readings from Last 3 Encounters:  08/02/20 145 lb (65.8 kg)  07/30/20 146 lb 9.6 oz (66.5 kg)  05/27/20 148 lb 3.2 oz (67.2 kg)      GEN:  Well nourished, well developed in no acute distress HEENT: Normal NECK: No JVD; No carotid bruits LYMPHATICS: No lymphadenopathy CARDIAC: RRR, systolic murmur RESPIRATORY:  Clear to auscultation without rales, wheezing or rhonchi  ABDOMEN: Soft, non-tender, non-distended MUSCULOSKELETAL:  No edema; difficulty raising right arm above shoulder level SKIN: Warm and dry NEUROLOGIC:  Alert and oriented x 3 PSYCHIATRIC:  Normal affect   ASSESSMENT:    1. Systolic murmur   2. PAD (peripheral artery disease) ([place])   3. Essential hypertension   4. Pure hypercholesterolemia    PLAN:    In order of problems listed above:  1. Systolic murmur noted on exam.  Echocardiogram showed normal systolic function, EF 65 to 70%, severely dilated left atrium, mild MR, aortic valve sclerosis without evidence for stenosis. 2. Atherosclerotic cardiovascular  disease noted on peripheral arteries on MRI.  Continue aspirin 81 mg daily,  Lipitor 40 mg daily. 3. History of hypertension, BP elevated today, usually controlled.  Continue Maxzide and losartan for now.  Consider uptitrating if elevated at follow-up visit. 4. History of hyperlipidemia, continue Lipitor 40 mg daily.  Recent blood work from [facility] reviewed, showing total cholesterol 183, LDL 81 which is improved from prior.  Continue current dose of Lipitor.  Follow-up in 6 months.  Total encounter time more than 70 minutes  Greater than 50% was spent in counseling and coordination of care with the patient  This note was generated in part or whole with voice recognition software. Voice recognition is usually quite accurate but there are transcription errors that can and very often do occur. I apologize for any typographical errors that were not detected and corrected.  Medication Adjustments/Labs and Tests Ordered: Current medicines are reviewed at length with the patient today.  Concerns regarding medicines are outlined above.  No orders of the defined types were placed in this encounter.  Meds ordered this encounter  Medications  . aspirin EC 81 MG tablet    Sig: Take 1 tablet (81 mg total) by mouth daily. Swallow whole.    Dispense:  90 tablet    Refill:  3    Patient Instructions  Medication Instructions:  Your physician recommends that you continue on your current medications as directed. Please refer to the Current Medication list given to you today.  *If you need a refill on your cardiac medications before your next appointment, please call your pharmacy*   Lab Work: None Ordered If you have labs (blood work) drawn today and your tests are completely normal, you will receive your results only by: Marland Kitchen MyChart Message (if you have MyChart) OR . A paper copy in the mail If you have any lab test that is abnormal or we need to change your treatment, we will call you to review  the results.   Testing/Procedures: None Ordered   Follow-Up: At [facility], you and your health needs are our priority.  As part of our continuing mission to provide you with exceptional heart care, we have created designated Provider Care Teams.  These Care Teams include your primary Cardiologist (physician) and Advanced Practice Providers (APPs -  Physician Assistants and Nurse Practitioners) who all work together to provide you with the care you need, when you need it.  We recommend signing up for the patient portal called "MyChart".  Sign up information is provided on this After Visit Summary.  MyChart is used to connect with patients for Virtual Visits (Telemedicine).  Patients are able to view lab/test results, encounter notes, upcoming appointments, etc.  Non-urgent messages can be sent to your provider as well.   To learn more about what you can do with MyChart, go to [email or web address].    Your next appointment:   6 month(s)  The format for your next appointment:   In Person  Provider:   Kate Sable, MD   Other Instructions      Signed, Kate Sable, MD  08/02/2020 4:45 PM    [place]

## 2020-08-08 ENCOUNTER — Ambulatory Visit: Payer: Medicare HMO | Admitting: Cardiology

## 2020-08-22 ENCOUNTER — Encounter: Payer: Self-pay | Admitting: Family Medicine

## 2020-08-22 ENCOUNTER — Other Ambulatory Visit: Payer: Self-pay

## 2020-08-22 ENCOUNTER — Ambulatory Visit (INDEPENDENT_AMBULATORY_CARE_PROVIDER_SITE_OTHER): Payer: Medicare HMO | Admitting: Family Medicine

## 2020-08-22 VITALS — BP 136/51 | HR 64 | Temp 97.7°F | Resp 16 | Ht 63.0 in | Wt 145.0 lb

## 2020-08-22 DIAGNOSIS — R7309 Other abnormal glucose: Secondary | ICD-10-CM | POA: Diagnosis not present

## 2020-08-22 DIAGNOSIS — Z1231 Encounter for screening mammogram for malignant neoplasm of breast: Secondary | ICD-10-CM

## 2020-08-22 DIAGNOSIS — M48062 Spinal stenosis, lumbar region with neurogenic claudication: Secondary | ICD-10-CM | POA: Diagnosis not present

## 2020-08-22 DIAGNOSIS — Z0000 Encounter for general adult medical examination without abnormal findings: Secondary | ICD-10-CM

## 2020-08-22 DIAGNOSIS — N183 Chronic kidney disease, stage 3 unspecified: Secondary | ICD-10-CM | POA: Diagnosis not present

## 2020-08-22 DIAGNOSIS — E782 Mixed hyperlipidemia: Secondary | ICD-10-CM

## 2020-08-22 DIAGNOSIS — N1831 Chronic kidney disease, stage 3a: Secondary | ICD-10-CM | POA: Diagnosis not present

## 2020-08-22 DIAGNOSIS — I129 Hypertensive chronic kidney disease with stage 1 through stage 4 chronic kidney disease, or unspecified chronic kidney disease: Secondary | ICD-10-CM | POA: Diagnosis not present

## 2020-08-22 DIAGNOSIS — M4726 Other spondylosis with radiculopathy, lumbar region: Secondary | ICD-10-CM

## 2020-08-22 MED ORDER — VERAPAMIL HCL 120 MG PO TABS: 120.0000 mg | ORAL_TABLET | Freq: Two times a day (BID) | ORAL | 3 refills | Status: DC

## 2020-08-22 MED ORDER — LOSARTAN POTASSIUM 100 MG PO TABS: 100.0000 mg | ORAL_TABLET | Freq: Every day | ORAL | 3 refills | Status: DC

## 2020-08-22 NOTE — Progress Notes (Signed)
Subjective:    Patient ID: Robin Lang, female    DOB: [birth year], 69 y.o.   MRN: [id number]  Robin Lang is a 69 y.o. female presenting on 08/22/2020 for Annual Exam   HPI   Here for Annual Physical and Lab Review.  CHRONIC HTN: Reports occasional BP readings. No new concerns Current Meds - Losartan 100mg  daily, Verapamil 120mg  BID, Triamterene-HCTZ 75-50mg  (HALF of tab daily) Reports good compliance, took meds today. Tolerating well, w/o complaints. Denies CP, dyspnea, HA, edema, dizziness / lightheadedness  HYPERLIPIDEMIA: - Reports no concerns. Last lipid panel 07/2020 per cardiology, controlled  - Currently taking Atorvastatin 40mg , tolerating well without side effects or myalgias On ASA 81, newly started.  Lumbar Spinal Stenosis, neurogenic claudication Followed by Dr Izora Ribas / [facility] Neurosurgery Major improvement over past several months now. She is going for her 3rd ESI injection next week. Completed PT from [facility] She does exercise few times a week If back pain flare starts, she does limit her activity/exercise She takes Tylenol PRN and ice.  PMH - Son diagnosed with Gilbert's Disease, asking about T Bilirubin.  Health Maintenance:  Previously may have had Zostavax shingles vaccine in past. She has had history of shingles. No shingrix dose. She is asking about it  COVID vaccine pfizer last does 01/26/20, due for booster anytime. She will get next  Declines Flu shot today will return or get at pharmacy  Due mammogram 09/2020 - ordered now, she can call to schedule, last negative 09/2019  Colonoscopy screening [place] Dr Glean Hess - 2 polyp, diverticulosis, hemorrhoid, next due in 10 years - 2025   Depression screen [facility] 2/9 08/22/2020 04/23/2020 12/11/2019  Decreased Interest 0 0 0  Down, Depressed, Hopeless 0 0 0  PHQ - 2 Score 0 0 0    Past Medical History:  Diagnosis Date  . Baker's cyst of knee, right 2015  . Diverticulosis   . Hyperlipidemia    . Hypertension   . Osteopenia    Past Surgical History:  Procedure Laterality Date  . CATARACT EXTRACTION Right 2017  . MENISCUS REPAIR Left 2012   Social History   Socioeconomic History  . Marital status: Divorced    Spouse name: Not on file  . Number of children: 2  . Years of education: Not on file  . Highest education level: Some college, no degree  Occupational History  . Not on file  Tobacco Use  . Smoking status: Former Smoker    Packs/day: 0.50    Years: 9.00    Pack years: 4.50    Quit date: 1989    Years since quitting: 32.7  . Smokeless tobacco: Never Used  Vaping Use  . Vaping Use: Never used  Substance and Sexual Activity  . Alcohol use: Yes    Alcohol/week: 2.0 standard drinks    Types: 2 Glasses of wine per week    Comment: occ  . Drug use: Never  . Sexual activity: Not Currently  Other Topics Concern  . Not on file  Social History Narrative  . Not on file   Social Determinants of Health   Financial Resource Strain:   . Difficulty of Paying Living Expenses: Not on file  Food Insecurity:   . Worried About Charity fundraiser in the Last Year: Not on file  . Ran Out of Food in the Last Year: Not on file  Transportation Needs:   . Lack of Transportation (Medical): Not on file  . Lack of Transportation (  Non-Medical): Not on file  Physical Activity:   . Days of Exercise per Week: Not on file  . Minutes of Exercise per Session: Not on file  Stress:   . Feeling of Stress : Not on file  Social Connections:   . Frequency of Communication with Friends and Family: Not on file  . Frequency of Social Gatherings with Friends and Family: Not on file  . Attends Religious Services: Not on file  . Active Member of Clubs or Organizations: Not on file  . Attends Archivist Meetings: Not on file  . Marital Status: Not on file  Intimate Partner Violence:   . Fear of Current or Ex-Partner: Not on file  . Emotionally Abused: Not on file  .  Physically Abused: Not on file  . Sexually Abused: Not on file   Family History  Problem Relation Age of Onset  . Stroke Mother   . Renal cancer Mother   . Heart disease Father   . Heart attack Father   . Lung cancer Sister        metastatic  . Thyroid cancer Brother        metastatic  . Heart disease Sister   . Multiple sclerosis Brother   . Breast cancer Neg Hx    Current Outpatient Medications on File Prior to Visit  Medication Sig  . acetaminophen (TYLENOL) 650 MG CR tablet Take by mouth.  . Ascorbic Acid (VITAMIN C) 100 MG tablet Take 100 mg by mouth daily.  Marland Kitchen aspirin EC 81 MG tablet Take 1 tablet (81 mg total) by mouth daily. Swallow whole.  Marland Kitchen atorvastatin (LIPITOR) 40 MG tablet Take 1 tablet (40 mg total) by mouth daily.  . cholecalciferol (VITAMIN D3) 25 MCG (1000 UNIT) tablet Take 1,000 Units by mouth daily.  . diclofenac Sodium (VOLTAREN) 1 % GEL Apply topically 3 (three) times daily as needed.  Marland Kitchen Specialty Vitamins Products (MG-PLUS PROTEIN PO) Take by mouth.  . triamterene-hydrochlorothiazide (MAXZIDE) 75-50 MG tablet Take 1/2 (one-half) tablet by mouth once daily  . Zinc 30 MG CAPS Take by mouth.   No current facility-administered medications on file prior to visit.    Review of Systems  Constitutional: Negative for activity change, appetite change, chills, diaphoresis, fatigue and fever.  HENT: Negative for congestion and hearing loss.   Eyes: Negative for visual disturbance.  Respiratory: Negative for apnea, cough, chest tightness, shortness of breath and wheezing.   Cardiovascular: Negative for chest pain, palpitations and leg swelling.  Gastrointestinal: Negative for abdominal pain, anal bleeding, blood in stool, constipation, diarrhea, nausea and vomiting.  Endocrine: Negative for cold intolerance.  Genitourinary: Negative for dysuria, frequency and hematuria.  Musculoskeletal: Negative for arthralgias, back pain and neck pain.  Skin: Negative for rash.    Allergic/Immunologic: Negative for environmental allergies.  Neurological: Negative for dizziness, weakness, light-headedness, numbness and headaches.  Hematological: Negative for adenopathy.  Psychiatric/Behavioral: Negative for behavioral problems, dysphoric mood and sleep disturbance. The patient is not nervous/anxious.    Per HPI unless specifically indicated above     Objective:    BP (!) 136/51   Pulse 64   Temp 97.7 F (36.5 C) (Temporal)   Resp 16   Ht 5\' 3"  (1.6 m)   Wt 145 lb (65.8 kg)   SpO2 100%   BMI 25.69 kg/m   Wt Readings from Last 3 Encounters:  08/22/20 145 lb (65.8 kg)  08/02/20 145 lb (65.8 kg)  07/30/20 146 lb 9.6 oz (66.5 kg)  Physical Exam Vitals and nursing note reviewed.  Constitutional:      General: She is not in acute distress.    Appearance: She is well-developed. She is not diaphoretic.     Comments: Well-appearing, comfortable, cooperative  HENT:     Head: Normocephalic and atraumatic.  Eyes:     General:        Right eye: No discharge.        Left eye: No discharge.     Conjunctiva/sclera: Conjunctivae normal.     Pupils: Pupils are equal, round, and reactive to light.  Neck:     Thyroid: No thyromegaly.     Vascular: No carotid bruit.  Cardiovascular:     Rate and Rhythm: Normal rate and regular rhythm.     Heart sounds: Normal heart sounds. No murmur heard.   Pulmonary:     Effort: Pulmonary effort is normal. No respiratory distress.     Breath sounds: Normal breath sounds. No wheezing or rales.  Abdominal:     General: Bowel sounds are normal. There is no distension.     Palpations: Abdomen is soft. There is no mass.     Tenderness: There is no abdominal tenderness.  Musculoskeletal:        General: No tenderness. Normal range of motion.     Cervical back: Normal range of motion and neck supple.     Right lower leg: No edema.     Left lower leg: No edema.     Comments: Upper / Lower Extremities: - Normal muscle tone,  strength bilateral upper extremities 5/5, lower extremities 5/5  Lymphadenopathy:     Cervical: No cervical adenopathy.  Skin:    General: Skin is warm and dry.     Findings: No erythema or rash.  Neurological:     Mental Status: She is alert and oriented to person, place, and time.     Comments: Distal sensation intact to light touch all extremities  Psychiatric:        Behavior: Behavior normal.     Comments: Well groomed, good eye contact, normal speech and thoughts    Results for orders placed or performed in visit on 07/29/20  Lipid panel  Result Value Ref Range   Cholesterol, Total 183 100 - 199 mg/dL   Triglycerides 69 0 - 149 mg/dL   HDL 89 >39 mg/dL   VLDL Cholesterol Cal 13 5 - 40 mg/dL   LDL Chol Calc (NIH) 81 0 - 99 mg/dL   Chol/HDL Ratio 2.1 0.0 - 4.4 ratio      Assessment & Plan:   Problem List Items Addressed This Visit    Spinal stenosis of lumbar region with neurogenic claudication    See A&P OA/DJD lumbar spine Managed by [facility] Neurosurgery      Osteoarthritis of spine with radiculopathy, lumbar region    Followed by [facility] Neurosurgery Dr Izora Ribas S/p MRI in past, ESI injections, PT Continues management      Mixed hyperlipidemia    Controlled on last lipid per cards The 10-year ASCVD risk score Mikey Bussing DC Jr., et al., 2013) is: 12.6%  Plan: 1. Continue current meds - Atorvastatin 40mg  daily 2. Continue ASA 81mg  for primary ASCVD risk reduction - newly added by Cards, has some easy bruising 3. Encourage improved lifestyle - low carb/cholesterol, reduce portion size, continue improving regular exercise      Relevant Medications   losartan (COZAAR) 100 MG tablet   verapamil (CALAN) 120 MG tablet  Other Relevant Orders   TSH   Benign hypertension with chronic kidney disease, stage III ([place])    Well-controlled HTN - Home BP readings  Complication with CKDIII   Plan:  1. Continue current BP regimen  Losartan 100mg  daily, Verapamil 120mg  BID,  Triamterene-HCTZ 75-50mg  (HALF of tab daily) 2. Encourage improved lifestyle - low sodium diet, regular exercise 3. Continue monitor BP outside office, bring readings to next visit, if persistently >140/90 or new symptoms notify office sooner      Relevant Medications   losartan (COZAAR) 100 MG tablet   verapamil (CALAN) 120 MG tablet   Other Relevant Orders   CBC with Differential/Platelet   Comprehensive metabolic panel    Other Visit Diagnoses    Annual physical exam    -  Primary   Relevant Orders   CBC with Differential/Platelet   Hemoglobin A1c   TSH   Comprehensive metabolic panel   Screening mammogram for breast cancer       Relevant Orders   MM 3D SCREEN BREAST BILATERAL   Stage 3a chronic kidney disease ([place])       Relevant Orders   Hemoglobin A1c   Comprehensive metabolic panel   Abnormal glucose       Relevant Orders   Hemoglobin A1c      Updated Health Maintenance information - COVID booster next then 2 weeks for Flu Shot  -Future shingrix when ready - mammogram ordered 3D yearly - next colonoscopy 2025 if planning Reviewed recent lab results with patient Encouraged improvement to lifestyle with diet and exercise - Goal of weight loss    Meds ordered this encounter  Medications  . losartan (COZAAR) 100 MG tablet    Sig: Take 1 tablet (100 mg total) by mouth daily.    Dispense:  90 tablet    Refill:  3    Add refills  . verapamil (CALAN) 120 MG tablet    Sig: Take 1 tablet (120 mg total) by mouth 2 (two) times daily.    Dispense:  180 tablet    Refill:  3    Add refills   Orders Placed This Encounter  Procedures  . MM 3D SCREEN BREAST BILATERAL    Standing Status:   Future    Standing Expiration Date:   02/20/2021    Order Specific Question:   Reason for Exam (SYMPTOM  OR DIAGNOSIS REQUIRED)    Answer:   Screening bilateral 3D Mammogram [place]    Order Specific Question:   Preferred imaging location?    Answer:   [facility]  . CBC with  Differential/Platelet  . Hemoglobin A1c  . TSH  . Comprehensive metabolic panel    Order Specific Question:   Has the patient fasted?    Answer:   Yes    Order Specific Question:   Release to patient    Answer:   Immediate     Follow up plan: Return in about 1 year (around 08/22/2021) for 1 year Annual Physical (fasting AM, [facility] AFTER).   Nobie Putnam, [facility] 08/22/2020, 8:38 AM

## 2020-08-22 NOTE — Assessment & Plan Note (Addendum)
Controlled on last lipid per cards The 10-year ASCVD risk score Mikey Bussing DC Jr., et al., 2013) is: 12.6%  Plan: 1. Continue current meds - Atorvastatin 40mg  daily 2. Continue ASA 81mg  for primary ASCVD risk reduction - newly added by Cards, has some easy bruising 3. Encourage improved lifestyle - low carb/cholesterol, reduce portion size, continue improving regular exercise

## 2020-08-22 NOTE — Assessment & Plan Note (Signed)
Well-controlled HTN - Home BP readings  Complication with CKDIII   Plan:  1. Continue current BP regimen  Losartan 100mg  daily, Verapamil 120mg  BID, Triamterene-HCTZ 75-50mg  (HALF of tab daily) 2. Encourage improved lifestyle - low sodium diet, regular exercise 3. Continue monitor BP outside office, bring readings to next visit, if persistently >140/90 or new symptoms notify office sooner

## 2020-08-22 NOTE — Assessment & Plan Note (Signed)
Followed by [facility] Neurosurgery Dr Izora Ribas S/p MRI in past, ESI injections, PT Continues management

## 2020-08-22 NOTE — Assessment & Plan Note (Signed)
See A&P OA/DJD lumbar spine Managed by [facility]

## 2020-08-22 NOTE — Patient Instructions (Addendum)
Thank you for coming to the office today.  Vaccines - Recommend first get COVID booster - wait 2 weeks - then high dose Flu shot, here or at pharmacy - Then in future Spring 2022 can get Shingrix shingles 2 doses, 2 months apart, at pharmacy  Labs today, including chemistry Bilirubin, I don't think you have Gilbert's Disease, because last 3 labs over 2019-2021 had been normal Bilirubin  [facility] blood work  Refilled BP meds   Please schedule a Follow-up Appointment to: Return in about 1 year (around 08/22/2021) for 1 year Annual Physical (fasting AM, [facility] AFTER).  If you have any other questions or concerns, please feel free to call the office or send a message through [place]. You may also schedule an earlier appointment if necessary.  Additionally, you may be receiving a survey about your experience at our office within a few days to 1 week by e-mail or mail. We value your feedback.  Nobie Putnam, DO [place]

## 2020-08-23 DIAGNOSIS — Z0000 Encounter for general adult medical examination without abnormal findings: Secondary | ICD-10-CM | POA: Diagnosis not present

## 2020-08-23 DIAGNOSIS — R7309 Other abnormal glucose: Secondary | ICD-10-CM | POA: Diagnosis not present

## 2020-08-23 DIAGNOSIS — I129 Hypertensive chronic kidney disease with stage 1 through stage 4 chronic kidney disease, or unspecified chronic kidney disease: Secondary | ICD-10-CM | POA: Diagnosis not present

## 2020-08-23 DIAGNOSIS — E782 Mixed hyperlipidemia: Secondary | ICD-10-CM | POA: Diagnosis not present

## 2020-08-23 DIAGNOSIS — M7121 Synovial cyst of popliteal space [Baker], right knee: Secondary | ICD-10-CM | POA: Diagnosis not present

## 2020-08-23 DIAGNOSIS — M1731 Unilateral post-traumatic osteoarthritis, right knee: Secondary | ICD-10-CM | POA: Diagnosis not present

## 2020-08-23 DIAGNOSIS — G8929 Other chronic pain: Secondary | ICD-10-CM | POA: Diagnosis not present

## 2020-08-23 DIAGNOSIS — M25562 Pain in left knee: Secondary | ICD-10-CM | POA: Diagnosis not present

## 2020-08-23 DIAGNOSIS — N183 Chronic kidney disease, stage 3 unspecified: Secondary | ICD-10-CM | POA: Diagnosis not present

## 2020-08-23 DIAGNOSIS — M1732 Unilateral post-traumatic osteoarthritis, left knee: Secondary | ICD-10-CM | POA: Diagnosis not present

## 2020-08-23 DIAGNOSIS — M25561 Pain in right knee: Secondary | ICD-10-CM | POA: Diagnosis not present

## 2020-08-23 DIAGNOSIS — M25462 Effusion, left knee: Secondary | ICD-10-CM | POA: Diagnosis not present

## 2020-08-23 DIAGNOSIS — N1831 Chronic kidney disease, stage 3a: Secondary | ICD-10-CM | POA: Diagnosis not present

## 2020-08-24 LAB — CBC WITH DIFFERENTIAL/PLATELET
Basophils Absolute: 0 10*3/uL (ref 0.0–0.2)
Basos: 1 %
EOS (ABSOLUTE): 0.1 10*3/uL (ref 0.0–0.4)
Eos: 3 %
Hematocrit: 40.4 % (ref 34.0–46.6)
Hemoglobin: 13.3 g/dL (ref 11.1–15.9)
Immature Grans (Abs): 0 10*3/uL (ref 0.0–0.1)
Immature Granulocytes: 0 %
Lymphocytes Absolute: 1.6 10*3/uL (ref 0.7–3.1)
Lymphs: 38 %
MCH: 30.7 pg (ref 26.6–33.0)
MCHC: 32.9 g/dL (ref 31.5–35.7)
MCV: 93 fL (ref 79–97)
Monocytes Absolute: 0.5 10*3/uL (ref 0.1–0.9)
Monocytes: 13 %
Neutrophils Absolute: 1.8 10*3/uL (ref 1.4–7.0)
Neutrophils: 45 %
Platelets: 237 10*3/uL (ref 150–450)
RBC: 4.33 x10E6/uL (ref 3.77–5.28)
RDW: 12.7 % (ref 11.7–15.4)
WBC: 4 10*3/uL (ref 3.4–10.8)

## 2020-08-24 LAB — COMPREHENSIVE METABOLIC PANEL
ALT: 19 IU/L (ref 0–32)
AST: 18 IU/L (ref 0–40)
Albumin/Globulin Ratio: 2 (ref 1.2–2.2)
Albumin: 4.4 g/dL (ref 3.8–4.8)
Alkaline Phosphatase: 63 IU/L (ref 44–121)
BUN/Creatinine Ratio: 19 (ref 12–28)
BUN: 15 mg/dL (ref 8–27)
Bilirubin Total: 0.6 mg/dL (ref 0.0–1.2)
CO2: 24 mmol/L (ref 20–29)
Calcium: 10.9 mg/dL — ABNORMAL HIGH (ref 8.7–10.3)
Chloride: 101 mmol/L (ref 96–106)
Creatinine, Ser: 0.78 mg/dL (ref 0.57–1.00)
GFR calc Af Amer: 89 mL/min/{1.73_m2} (ref >59)
GFR calc non Af Amer: 77 mL/min/{1.73_m2} (ref >59)
Globulin, Total: 2.2 g/dL (ref 1.5–4.5)
Glucose: 103 mg/dL — ABNORMAL HIGH (ref 65–99)
Potassium: 3.9 mmol/L (ref 3.5–5.2)
Sodium: 138 mmol/L (ref 134–144)
Total Protein: 6.6 g/dL (ref 6.0–8.5)

## 2020-08-24 LAB — TSH: TSH: 1.81 u[IU]/mL (ref 0.450–4.500)

## 2020-08-24 LAB — HEMOGLOBIN A1C
Est. average glucose Bld gHb Est-mCnc: 111 mg/dL
Hgb A1c MFr Bld: 5.5 % (ref 4.8–5.6)

## 2020-08-28 DIAGNOSIS — M5442 Lumbago with sciatica, left side: Secondary | ICD-10-CM | POA: Diagnosis not present

## 2020-08-28 DIAGNOSIS — M48062 Spinal stenosis, lumbar region with neurogenic claudication: Secondary | ICD-10-CM | POA: Diagnosis not present

## 2020-09-07 DIAGNOSIS — R69 Illness, unspecified: Secondary | ICD-10-CM | POA: Diagnosis not present

## 2020-09-11 DIAGNOSIS — M48062 Spinal stenosis, lumbar region with neurogenic claudication: Secondary | ICD-10-CM | POA: Diagnosis not present

## 2020-09-11 DIAGNOSIS — G8929 Other chronic pain: Secondary | ICD-10-CM | POA: Diagnosis not present

## 2020-09-11 DIAGNOSIS — M7062 Trochanteric bursitis, left hip: Secondary | ICD-10-CM | POA: Diagnosis not present

## 2020-09-11 DIAGNOSIS — M5442 Lumbago with sciatica, left side: Secondary | ICD-10-CM | POA: Diagnosis not present

## 2020-09-18 DIAGNOSIS — G5761 Lesion of plantar nerve, right lower limb: Secondary | ICD-10-CM | POA: Diagnosis not present

## 2020-09-18 DIAGNOSIS — M67479 Ganglion, unspecified ankle and foot: Secondary | ICD-10-CM | POA: Diagnosis not present

## 2020-09-18 DIAGNOSIS — G5762 Lesion of plantar nerve, left lower limb: Secondary | ICD-10-CM | POA: Diagnosis not present

## 2020-10-01 DIAGNOSIS — G8929 Other chronic pain: Secondary | ICD-10-CM | POA: Diagnosis not present

## 2020-10-01 DIAGNOSIS — M1731 Unilateral post-traumatic osteoarthritis, right knee: Secondary | ICD-10-CM | POA: Diagnosis not present

## 2020-10-01 DIAGNOSIS — M25462 Effusion, left knee: Secondary | ICD-10-CM | POA: Diagnosis not present

## 2020-10-01 DIAGNOSIS — M1732 Unilateral post-traumatic osteoarthritis, left knee: Secondary | ICD-10-CM | POA: Diagnosis not present

## 2020-10-01 DIAGNOSIS — M7121 Synovial cyst of popliteal space [Baker], right knee: Secondary | ICD-10-CM | POA: Diagnosis not present

## 2020-10-01 DIAGNOSIS — M25561 Pain in right knee: Secondary | ICD-10-CM | POA: Diagnosis not present

## 2020-10-01 DIAGNOSIS — M25562 Pain in left knee: Secondary | ICD-10-CM | POA: Diagnosis not present

## 2020-10-10 IMAGING — MG MM DIGITAL SCREENING BILAT W/ TOMO W/ CAD
8 series · 8 of 24 positions shown · non-contrast
Comparison: None.

CLINICAL DATA: Screening.

EXAM:
DIGITAL SCREENING BILATERAL MAMMOGRAM WITH TOMO AND CAD

[L CC synth-2D]
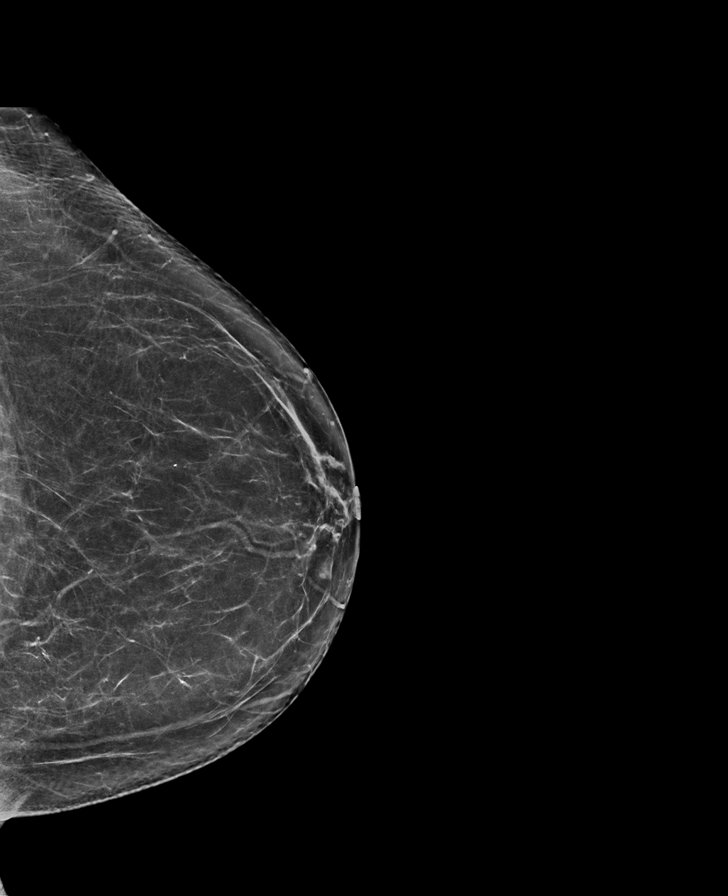

[L MLO synth-2D]
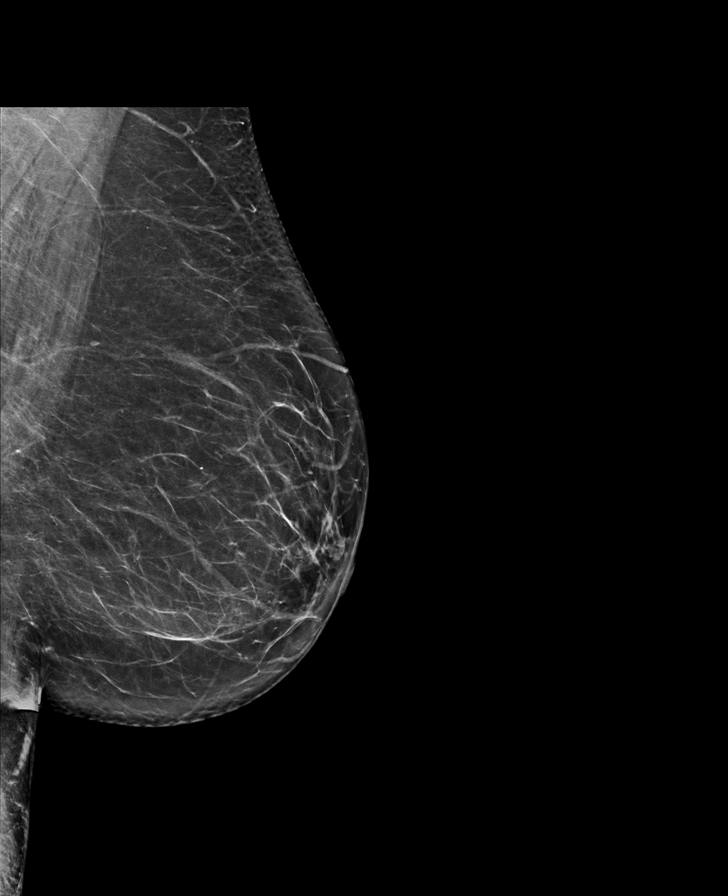

[R CC synth-2D]
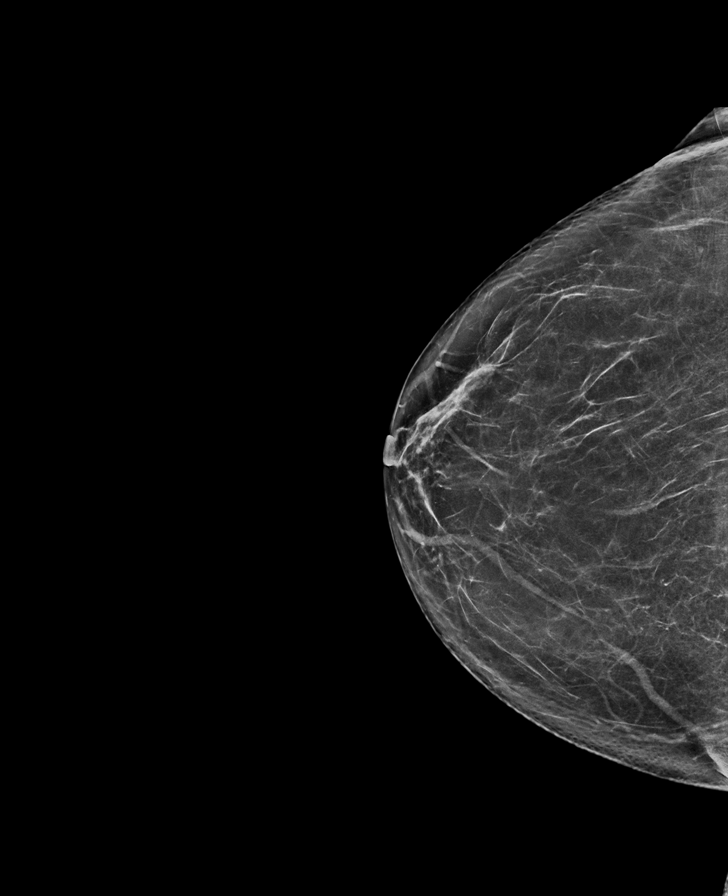

[R MLO synth-2D]
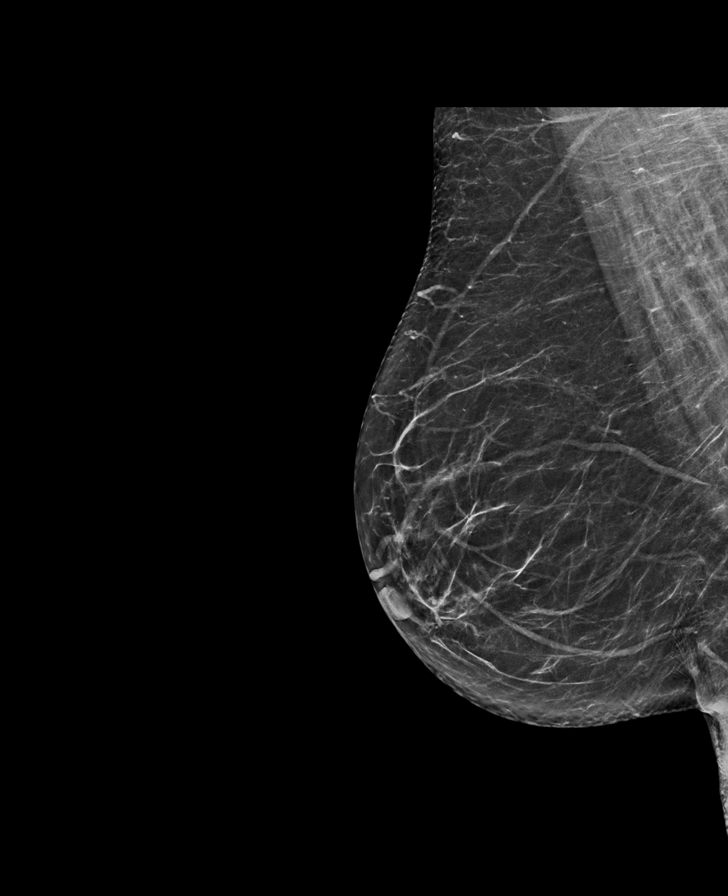

[L MLO tomo · tomo slice 39/77.0]
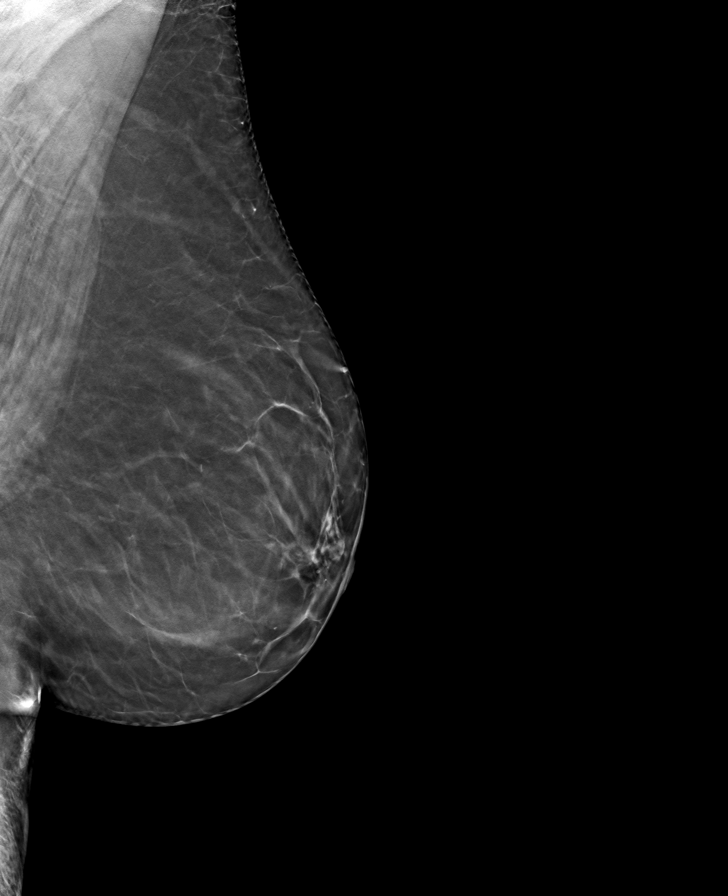

[L CC tomo · tomo slice 37/72.0]
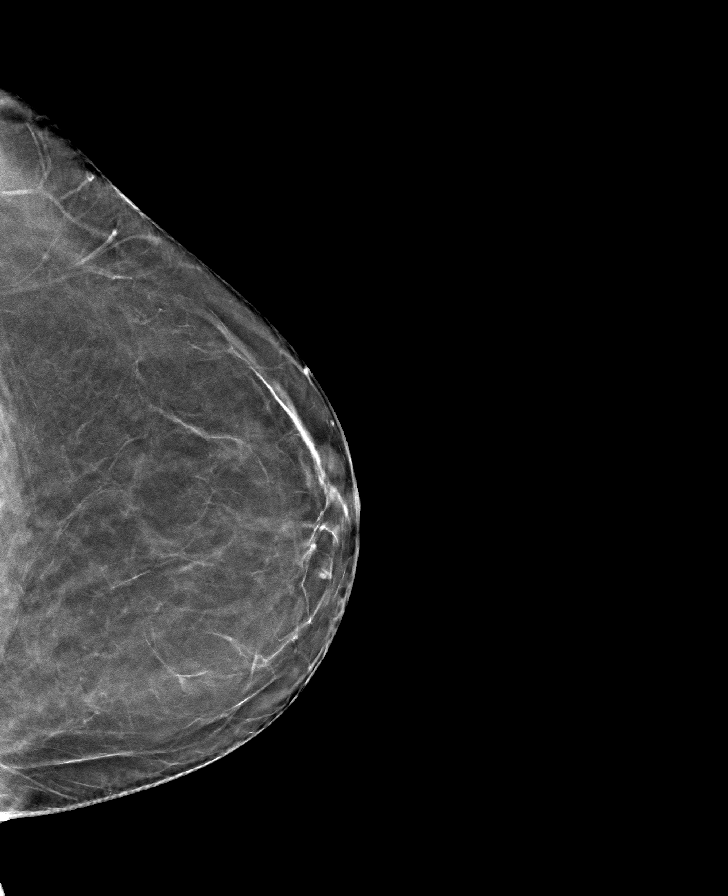

[R CC tomo · tomo slice 35/69.0]
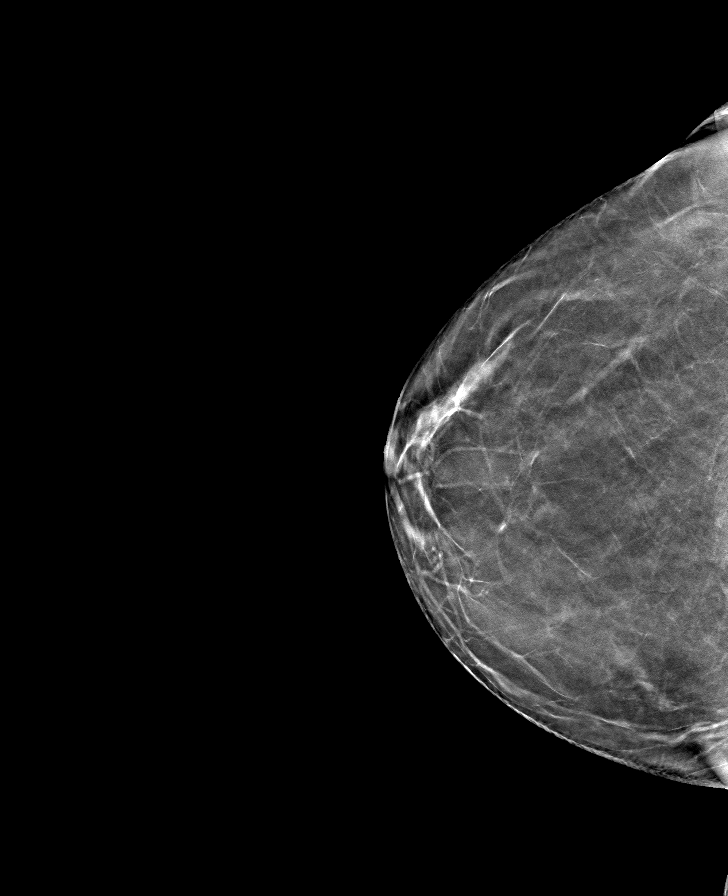

[R MLO tomo · tomo slice 37/73.0]
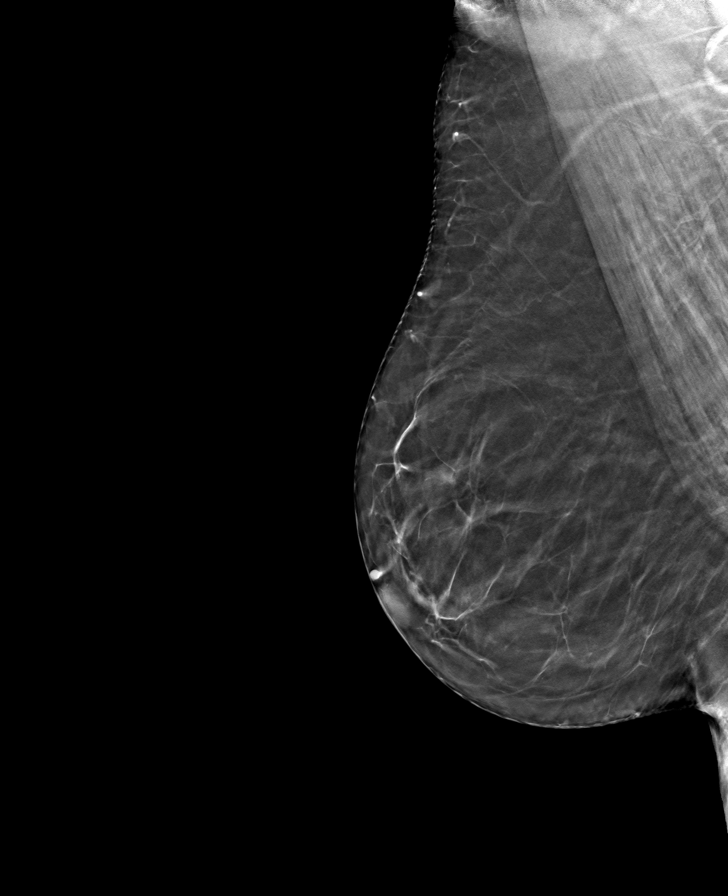

[8 of 24 positions shown; findings below may reference images not displayed]

ACR Breast Density Category b: There are scattered areas of
fibroglandular density.
FINDINGS: There are no findings suspicious for malignancy. Images were
processed with CAD.
IMPRESSION: No mammographic evidence of malignancy. A result letter of this
screening mammogram will be mailed directly to the patient.

RECOMMENDATION:
Screening mammogram in one year. (Code:Y5-G-EJ6)

BI-RADS CATEGORY  1: Negative.

## 2020-10-18 ENCOUNTER — Other Ambulatory Visit: Payer: Self-pay | Admitting: Family Medicine

## 2020-10-18 DIAGNOSIS — Z1231 Encounter for screening mammogram for malignant neoplasm of breast: Secondary | ICD-10-CM

## 2020-10-22 DIAGNOSIS — K219 Gastro-esophageal reflux disease without esophagitis: Secondary | ICD-10-CM

## 2020-10-22 MED ORDER — SUCRALFATE 1 G PO TABS: 1.0000 g | ORAL_TABLET | Freq: Three times a day (TID) | ORAL | 2 refills | Status: DC

## 2020-11-25 DIAGNOSIS — G8929 Other chronic pain: Secondary | ICD-10-CM | POA: Diagnosis not present

## 2020-11-25 DIAGNOSIS — M25562 Pain in left knee: Secondary | ICD-10-CM | POA: Diagnosis not present

## 2020-11-25 DIAGNOSIS — M25462 Effusion, left knee: Secondary | ICD-10-CM | POA: Diagnosis not present

## 2020-11-25 DIAGNOSIS — M1732 Unilateral post-traumatic osteoarthritis, left knee: Secondary | ICD-10-CM | POA: Diagnosis not present

## 2020-11-29 ENCOUNTER — Other Ambulatory Visit: Payer: Self-pay | Admitting: Family Medicine

## 2020-12-06 DIAGNOSIS — Z20828 Contact with and (suspected) exposure to other viral communicable diseases: Secondary | ICD-10-CM | POA: Diagnosis not present

## 2020-12-13 DIAGNOSIS — Z0100 Encounter for examination of eyes and vision without abnormal findings: Secondary | ICD-10-CM | POA: Diagnosis not present

## 2020-12-18 DIAGNOSIS — M5442 Lumbago with sciatica, left side: Secondary | ICD-10-CM | POA: Diagnosis not present

## 2020-12-18 DIAGNOSIS — M48062 Spinal stenosis, lumbar region with neurogenic claudication: Secondary | ICD-10-CM | POA: Diagnosis not present

## 2020-12-26 ENCOUNTER — Telehealth: Payer: Self-pay

## 2020-12-26 NOTE — Telephone Encounter (Signed)
Called patient in response to the below copied and pasted MyChart message thread. I informed her that the Lipid panel we drew in September should much improvement and that we were at goal. I explained there was no orders or recommendations for one to be drawn before this visit. Patient verbalized understanding and was grateful for the phone call.    6:25 PM Hello to Nurse Irine Heminger....my messages has a Lipid Panel reminder for my visit,  Could you kindly check with Dr. to make sure he doesn't want this done.  Thank you!  December 23, 2020  Me to [place], [place] "[name]"     1:27 PM Hi Kaiya,  Thanks for checking in! There are no labs ordered for before your visit with [place].  Hope you are staying warm and dry, We will see you soon!  Kerrin [facility] RN   Last read by Pricilla Loveless at 6:23 PM on 12/25/2020.     11:48 AM Emily Filbert, RN routed this conversation to Me    [place], [place] "[name]" to Kate Sable, MD     11:06 AM Will you be ordering any blood tests prior to my visit on 3.9.2022?  Thank you

## 2021-01-07 ENCOUNTER — Other Ambulatory Visit: Payer: Self-pay | Admitting: Family Medicine

## 2021-01-07 DIAGNOSIS — I10 Essential (primary) hypertension: Secondary | ICD-10-CM

## 2021-01-08 DIAGNOSIS — M5442 Lumbago with sciatica, left side: Secondary | ICD-10-CM | POA: Diagnosis not present

## 2021-01-08 DIAGNOSIS — G8929 Other chronic pain: Secondary | ICD-10-CM | POA: Diagnosis not present

## 2021-01-08 DIAGNOSIS — M7062 Trochanteric bursitis, left hip: Secondary | ICD-10-CM | POA: Diagnosis not present

## 2021-01-08 DIAGNOSIS — M48062 Spinal stenosis, lumbar region with neurogenic claudication: Secondary | ICD-10-CM | POA: Diagnosis not present

## 2021-01-09 DIAGNOSIS — H524 Presbyopia: Secondary | ICD-10-CM | POA: Diagnosis not present

## 2021-01-22 ENCOUNTER — Encounter: Payer: Medicare HMO | Admitting: Dermatology

## 2021-01-26 DIAGNOSIS — K219 Gastro-esophageal reflux disease without esophagitis: Secondary | ICD-10-CM

## 2021-01-26 DIAGNOSIS — M4726 Other spondylosis with radiculopathy, lumbar region: Secondary | ICD-10-CM

## 2021-01-27 MED ORDER — CYCLOBENZAPRINE HCL 10 MG PO TABS: 5.0000 mg | ORAL_TABLET | Freq: Three times a day (TID) | ORAL | 2 refills | Status: AC | PRN

## 2021-01-27 MED ORDER — OMEPRAZOLE 40 MG PO CPDR: 40.0000 mg | DELAYED_RELEASE_CAPSULE | Freq: Every day | ORAL | 1 refills | Status: AC

## 2021-02-03 ENCOUNTER — Ambulatory Visit: Payer: Medicare HMO | Admitting: Cardiology

## 2021-02-17 ENCOUNTER — Ambulatory Visit: Payer: Medicare HMO | Admitting: Family Medicine

## 2021-02-18 ENCOUNTER — Ambulatory Visit: Payer: Medicare HMO | Admitting: Family Medicine

## 2021-02-21 ENCOUNTER — Ambulatory Visit (INDEPENDENT_AMBULATORY_CARE_PROVIDER_SITE_OTHER): Payer: Medicare HMO | Admitting: Family Medicine

## 2021-02-21 ENCOUNTER — Encounter: Payer: Self-pay | Admitting: Family Medicine

## 2021-02-21 ENCOUNTER — Other Ambulatory Visit: Payer: Self-pay | Admitting: Family Medicine

## 2021-02-21 ENCOUNTER — Other Ambulatory Visit: Payer: Self-pay

## 2021-02-21 VITALS — BP 151/71 | HR 62 | Ht 62.0 in | Wt 143.2 lb

## 2021-02-21 DIAGNOSIS — N183 Chronic kidney disease, stage 3 unspecified: Secondary | ICD-10-CM

## 2021-02-21 DIAGNOSIS — R110 Nausea: Secondary | ICD-10-CM

## 2021-02-21 DIAGNOSIS — K219 Gastro-esophageal reflux disease without esophagitis: Secondary | ICD-10-CM | POA: Diagnosis not present

## 2021-02-21 DIAGNOSIS — I129 Hypertensive chronic kidney disease with stage 1 through stage 4 chronic kidney disease, or unspecified chronic kidney disease: Secondary | ICD-10-CM

## 2021-02-21 DIAGNOSIS — E782 Mixed hyperlipidemia: Secondary | ICD-10-CM

## 2021-02-21 MED ORDER — ONDANSETRON 4 MG PO TBDP: 4.0000 mg | ORAL_TABLET | Freq: Three times a day (TID) | ORAL | 0 refills | Status: AC | PRN

## 2021-02-21 NOTE — Patient Instructions (Addendum)
Thank you for coming to the office today.  Omeprazole 40mg  can be taken TWICE a day short term 1-2 weeks only if needed, otherwise keep with once daily.  Zofran as needed for nausea.  Future would recommend considering a GI Consultation especially if recurrent flares, or if this does not resolve, may need an Endoscopy scope from GI.  [facility] orders printed.  Please schedule a Follow-up Appointment to: Return if symptoms worsen or fail to improve.  If you have any other questions or concerns, please feel free to call the office or send a message through [place]. You may also schedule an earlier appointment if necessary.  Additionally, you may be receiving a survey about your experience at our office within a few days to 1 week by e-mail or mail. We value your feedback.  Nobie Putnam, DO [place]

## 2021-02-21 NOTE — Progress Notes (Signed)
Subjective:    Patient ID: Robin Lang, female    DOB: 1951/07/15, 70 y.o.   MRN: [id number]  Robin Lang is a 70 y.o. female presenting on 02/21/2021 for Weight Loss   HPI   Follow-up GERD / Weight Loss  Prior history of similar problem back in 01/2020 Treated with PPI and Carafate, had negative H Pylori work up.  Acute onset 01/28/21 had acute illness with abdominal discomfort and pain, and nausea. She took the Carafate again as needed with some relief. She did lose several lbs with illness. Now gradually improving.  Now taking Omeprazole 40mg  daily before breakfast still. Has not changed. Takes Tylenol PRN. Not on NSAIDs. She has had a Prednisone injection recently from Ortho.  Moving to [facility] at end of May  Denies any dark stools, blood in stool, diarrhea, constipation, fever chills, vomiting    Depression screen [facility] 2/9 08/22/2020 04/23/2020 12/11/2019  Decreased Interest 0 0 0  Down, Depressed, Hopeless 0 0 0  PHQ - 2 Score 0 0 0    Social History   Tobacco Use  . Smoking status: Former Smoker    Packs/day: 0.50    Years: 9.00    Pack years: 4.50    Quit date: 1989    Years since quitting: 33.2  . Smokeless tobacco: Never Used  Vaping Use  . Vaping Use: Never used  Substance Use Topics  . Alcohol use: Yes    Alcohol/week: 2.0 standard drinks    Types: 2 Glasses of wine per week    Comment: occ  . Drug use: Never    Review of Systems Per HPI unless specifically indicated above     Objective:    BP (!) 151/71   Pulse 62   Ht 5\' 2"  (1.575 m)   Wt 143 lb 3.2 oz (65 kg)   SpO2 99%   BMI 26.19 kg/m   Wt Readings from Last 3 Encounters:  02/21/21 143 lb 3.2 oz (65 kg)  08/22/20 145 lb (65.8 kg)  08/02/20 145 lb (65.8 kg)    Physical Exam Vitals and nursing note reviewed.  Constitutional:      General: She is not in acute distress.    Appearance: She is well-developed. She is not diaphoretic.     Comments: Well-appearing, comfortable,  cooperative  HENT:     Head: Normocephalic and atraumatic.  Eyes:     General:        Right eye: No discharge.        Left eye: No discharge.     Conjunctiva/sclera: Conjunctivae normal.  Cardiovascular:     Rate and Rhythm: Normal rate.  Pulmonary:     Effort: Pulmonary effort is normal.  Skin:    General: Skin is warm and dry.     Findings: No erythema or rash.  Neurological:     Mental Status: She is alert and oriented to person, place, and time.  Psychiatric:        Behavior: Behavior normal.     Comments: Well groomed, good eye contact, normal speech and thoughts       Results for orders placed or performed in visit on 08/22/20  CBC with Differential/Platelet  Result Value Ref Range   WBC 4.0 3.4 - 10.8 x10E3/uL   RBC 4.33 3.77 - 5.28 x10E6/uL   Hemoglobin 13.3 11.1 - 15.9 g/dL   Hematocrit 40.4 34.0 - 46.6 %   MCV 93 79 - 97 fL   MCH 30.7 26.6 -  33.0 pg   MCHC 32.9 31.5 - 35.7 g/dL   RDW 12.7 11.7 - 15.4 %   Platelets 237 150 - 450 x10E3/uL   Neutrophils 45 Not Estab. %   Lymphs 38 Not Estab. %   Monocytes 13 Not Estab. %   Eos 3 Not Estab. %   Basos 1 Not Estab. %   Neutrophils Absolute 1.8 1.4 - 7.0 x10E3/uL   Lymphocytes Absolute 1.6 0.7 - 3.1 x10E3/uL   Monocytes Absolute 0.5 0.1 - 0.9 x10E3/uL   EOS (ABSOLUTE) 0.1 0.0 - 0.4 x10E3/uL   Basophils Absolute 0.0 0.0 - 0.2 x10E3/uL   Immature Granulocytes 0 Not Estab. %   Immature Grans (Abs) 0.0 0.0 - 0.1 x10E3/uL  Hemoglobin A1c  Result Value Ref Range   Hgb A1c MFr Bld 5.5 4.8 - 5.6 %   Est. average glucose Bld gHb Est-mCnc 111 mg/dL  TSH  Result Value Ref Range   TSH 1.810 0.450 - 4.500 uIU/mL  Comprehensive metabolic panel  Result Value Ref Range   Glucose 103 (H) 65 - 99 mg/dL   BUN 15 8 - 27 mg/dL   Creatinine, Ser 0.78 0.57 - 1.00 mg/dL   GFR calc non Af Amer 77 >59 mL/min/1.73   GFR calc Af Amer 89 >59 mL/min/1.73   BUN/Creatinine Ratio 19 12 - 28   Sodium 138 134 - 144 mmol/L   Potassium  3.9 3.5 - 5.2 mmol/L   Chloride 101 96 - 106 mmol/L   CO2 24 20 - 29 mmol/L   Calcium 10.9 (H) 8.7 - 10.3 mg/dL   Total Protein 6.6 6.0 - 8.5 g/dL   Albumin 4.4 3.8 - 4.8 g/dL   Globulin, Total 2.2 1.5 - 4.5 g/dL   Albumin/Globulin Ratio 2.0 1.2 - 2.2   Bilirubin Total 0.6 0.0 - 1.2 mg/dL   Alkaline Phosphatase 63 44 - 121 IU/L   AST 18 0 - 40 IU/L   ALT 19 0 - 32 IU/L      Assessment & Plan:   Problem List Items Addressed This Visit   None   Visit Diagnoses    Gastroesophageal reflux disease, unspecified whether esophagitis present    -  Primary   Relevant Medications   ondansetron (ZOFRAN ODT) 4 MG disintegrating tablet   Nausea       Relevant Medications   ondansetron (ZOFRAN ODT) 4 MG disintegrating tablet      Acute flare on chronic GERD vs PUD - now gradually improving Prior flare 01/2020 Still on PPI 40mg  daily Omeprazole Not due to NSAID Likely stress is major factor Some wt loss with poor PO Advised she can double dose of Omeprazole 40mg  BID if need short term 1-2 week  Order labs CMET CBC Lipid today, [facility] she can go get labs.  Avoid NSAIDs  Re order Zofran ODT PRN. Stop Carafate if causing her nausea  She is moving to [place] end of May 2022, I advised her that next step in future may be to seek care at GI specialist when she moves, may warrant EGD Upper Endoscopy in future.   Meds ordered this encounter  Medications  . ondansetron (ZOFRAN ODT) 4 MG disintegrating tablet    Sig: Take 1 tablet (4 mg total) by mouth every 8 (eight) hours as needed for nausea or vomiting.    Dispense:  30 tablet    Refill:  0      Follow up plan: Return if symptoms worsen or fail to improve.  Nobie Putnam, [facility] 02/21/2021, 11:55 AM

## 2021-02-24 DIAGNOSIS — Z961 Presence of intraocular lens: Secondary | ICD-10-CM | POA: Diagnosis not present

## 2021-02-24 DIAGNOSIS — H2511 Age-related nuclear cataract, right eye: Secondary | ICD-10-CM | POA: Diagnosis not present

## 2021-02-24 DIAGNOSIS — H02831 Dermatochalasis of right upper eyelid: Secondary | ICD-10-CM | POA: Diagnosis not present

## 2021-02-24 DIAGNOSIS — H25011 Cortical age-related cataract, right eye: Secondary | ICD-10-CM | POA: Diagnosis not present

## 2021-02-27 DIAGNOSIS — I129 Hypertensive chronic kidney disease with stage 1 through stage 4 chronic kidney disease, or unspecified chronic kidney disease: Secondary | ICD-10-CM | POA: Diagnosis not present

## 2021-02-27 DIAGNOSIS — E782 Mixed hyperlipidemia: Secondary | ICD-10-CM | POA: Diagnosis not present

## 2021-02-27 DIAGNOSIS — N183 Chronic kidney disease, stage 3 unspecified: Secondary | ICD-10-CM | POA: Diagnosis not present

## 2021-02-28 LAB — COMPREHENSIVE METABOLIC PANEL
ALT: 29 IU/L (ref 0–32)
AST: 23 IU/L (ref 0–40)
Albumin/Globulin Ratio: 2.3 — ABNORMAL HIGH (ref 1.2–2.2)
Albumin: 4.4 g/dL (ref 3.8–4.8)
Alkaline Phosphatase: 60 IU/L (ref 44–121)
BUN/Creatinine Ratio: 23 (ref 12–28)
BUN: 22 mg/dL (ref 8–27)
Bilirubin Total: 0.9 mg/dL (ref 0.0–1.2)
CO2: 24 mmol/L (ref 20–29)
Calcium: 10 mg/dL (ref 8.7–10.3)
Chloride: 99 mmol/L (ref 96–106)
Creatinine, Ser: 0.97 mg/dL (ref 0.57–1.00)
Globulin, Total: 1.9 g/dL (ref 1.5–4.5)
Glucose: 76 mg/dL (ref 65–99)
Potassium: 3.9 mmol/L (ref 3.5–5.2)
Sodium: 139 mmol/L (ref 134–144)
Total Protein: 6.3 g/dL (ref 6.0–8.5)
eGFR: 63 mL/min/{1.73_m2} (ref >59)

## 2021-02-28 LAB — CBC WITH DIFFERENTIAL/PLATELET
Basophils Absolute: 0 10*3/uL (ref 0.0–0.2)
Basos: 1 %
EOS (ABSOLUTE): 0.1 10*3/uL (ref 0.0–0.4)
Eos: 2 %
Hematocrit: 43.2 % (ref 34.0–46.6)
Hemoglobin: 14.2 g/dL (ref 11.1–15.9)
Immature Grans (Abs): 0 10*3/uL (ref 0.0–0.1)
Immature Granulocytes: 0 %
Lymphocytes Absolute: 1.8 10*3/uL (ref 0.7–3.1)
Lymphs: 38 %
MCH: 31 pg (ref 26.6–33.0)
MCHC: 32.9 g/dL (ref 31.5–35.7)
MCV: 94 fL (ref 79–97)
Monocytes Absolute: 0.6 10*3/uL (ref 0.1–0.9)
Monocytes: 12 %
Neutrophils Absolute: 2.2 10*3/uL (ref 1.4–7.0)
Neutrophils: 47 %
Platelets: 260 10*3/uL (ref 150–450)
RBC: 4.58 x10E6/uL (ref 3.77–5.28)
RDW: 12.7 % (ref 11.7–15.4)
WBC: 4.7 10*3/uL (ref 3.4–10.8)

## 2021-02-28 LAB — LIPID PANEL
Chol/HDL Ratio: 1.9 ratio (ref 0.0–4.4)
Cholesterol, Total: 175 mg/dL (ref 100–199)
HDL: 92 mg/dL (ref >39)
LDL Chol Calc (NIH): 73 mg/dL (ref 0–99)
Triglycerides: 49 mg/dL (ref 0–149)
VLDL Cholesterol Cal: 10 mg/dL (ref 5–40)

## 2021-03-03 ENCOUNTER — Encounter: Payer: Self-pay | Admitting: Family Medicine

## 2021-03-03 ENCOUNTER — Other Ambulatory Visit: Payer: Self-pay

## 2021-03-03 ENCOUNTER — Ambulatory Visit (INDEPENDENT_AMBULATORY_CARE_PROVIDER_SITE_OTHER): Payer: Medicare HMO | Admitting: Family Medicine

## 2021-03-03 VITALS — BP 140/79 | HR 61 | Ht 62.0 in | Wt 142.4 lb

## 2021-03-03 DIAGNOSIS — Z862 Personal history of diseases of the blood and blood-forming organs and certain disorders involving the immune mechanism: Secondary | ICD-10-CM | POA: Diagnosis not present

## 2021-03-03 DIAGNOSIS — S51812A Laceration without foreign body of left forearm, initial encounter: Secondary | ICD-10-CM

## 2021-03-03 DIAGNOSIS — Z87898 Personal history of other specified conditions: Secondary | ICD-10-CM

## 2021-03-03 NOTE — Progress Notes (Signed)
Subjective:    Patient ID: Robin Lang, female    DOB: 1951/04/24, 70 y.o.   MRN: [id number]  Robin Lang is a 70 y.o. female presenting on 03/03/2021 for Bleeding/Bruising   HPI   Left Forearm, laceration initial History of increased Bruising/Bleeding Reports onset acute injury left forearm last Tuesday from metal window screen scratch and caused laceration on forearm, and some bruising and bleeding. She has used saline rinse and aquaphor with improvement, now has scab. History of easy bruising bleeding. She was on Aspirin baby dose 61m trial in past but worsened bruising has stopped this. Not on blood thinner, no chronic problem except more recently.    [facility]/22/2020 04/23/2020 12/11/2019  Decreased Interest 0 0 0  Down, Depressed, Hopeless 0 0 0  PHQ - 2 Score 0 0 0    Social History   Tobacco Use  . Smoking status: Former Smoker    Packs/day: 0.50    Years: 9.00    Pack years: 4.50    Quit date: 1989    Years since quitting: 33.3  . Smokeless tobacco: Never Used  Vaping Use  . Vaping Use: Never used  Substance Use Topics  . Alcohol use: Yes    Alcohol/week: 2.0 standard drinks    Types: 2 Glasses of wine per week    Comment: occ  . Drug use: Never    Review of Systems Per HPI unless specifically indicated above     Objective:    BP 140/79   Pulse 61   Ht _0  (1.575 m)   Wt 142 lb 6.4 oz (64.6 kg)   SpO2 99%   BMI 26.05 kg/m   Wt Readings from Last 3 Encounters:  03/03/21 142 lb 6.4 oz (64.6 kg)  02/21/21 143 lb 3.2 oz (65 kg)  08/22/20 145 lb (65.8 kg)    Physical Exam Vitals and nursing note reviewed.  Constitutional:      General: She is not in acute distress.    Appearance: She is well-developed. She is not diaphoretic.     Comments: Well-appearing, comfortable, cooperative  HENT:     Head: Normocephalic and atraumatic.  Eyes:     General:        Right eye: No discharge.        Left eye: No discharge.      Conjunctiva/sclera: Conjunctivae normal.  Cardiovascular:     Rate and Rhythm: Normal rate.  Pulmonary:     Effort: Pulmonary effort is normal.  Skin:    General: Skin is warm and dry.     Findings: Bruising (forearms) and lesion (left inner forearm approx 3 cm laceration with scab healing. no drainage or swelling or extending redness) present. No erythema or rash.  Neurological:     Mental Status: She is alert and oriented to person, place, and time.  Psychiatric:        Behavior: Behavior normal.     Comments: Well groomed, good eye contact, normal speech and thoughts    Results for orders placed or performed in visit on 02/21/21  CBC with Differential/Platelet  Result Value Ref Range   WBC 4.7 3.4 - 10.8 x10E3/uL   RBC 4.58 3.77 - 5.28 x10E6/uL   Hemoglobin 14.2 11.1 - 15.9 g/dL   Hematocrit 43.2 34.0 - 46.6 %   MCV 94 79 - 97 fL   MCH 31.0 26.6 - 33.0 pg   MCHC 32.9 31.5 - 35.7 g/dL   RDW 12.7  11.7 - 15.4 %   Platelets 260 150 - 450 x10E3/uL   Neutrophils 47 Not Estab. %   Lymphs 38 Not Estab. %   Monocytes 12 Not Estab. %   Eos 2 Not Estab. %   Basos 1 Not Estab. %   Neutrophils Absolute 2.2 1.4 - 7.0 x10E3/uL   Lymphocytes Absolute 1.8 0.7 - 3.1 x10E3/uL   Monocytes Absolute 0.6 0.1 - 0.9 x10E3/uL   EOS (ABSOLUTE) 0.1 0.0 - 0.4 x10E3/uL   Basophils Absolute 0.0 0.0 - 0.2 x10E3/uL   Immature Granulocytes 0 Not Estab. %   Immature Grans (Abs) 0.0 0.0 - 0.1 x10E3/uL  Lipid panel  Result Value Ref Range   Cholesterol, Total 175 100 - 199 mg/dL   Triglycerides 49 0 - 149 mg/dL   HDL 92 >39 mg/dL   VLDL Cholesterol Cal 10 5 - 40 mg/dL   LDL Chol Calc (NIH) 73 0 - 99 mg/dL   Chol/HDL Ratio 1.9 0.0 - 4.4 ratio  Comprehensive metabolic panel  Result Value Ref Range   Glucose 76 65 - 99 mg/dL   BUN 22 8 - 27 mg/dL   Creatinine, Ser 0.97 0.57 - 1.00 mg/dL   eGFR 63 >59 mL/min/1.73   BUN/Creatinine Ratio 23 12 - 28   Sodium 139 134 - 144 mmol/L   Potassium 3.9 3.5 -  5.2 mmol/L   Chloride 99 96 - 106 mmol/L   CO2 24 20 - 29 mmol/L   Calcium 10.0 8.7 - 10.3 mg/dL   Total Protein 6.3 6.0 - 8.5 g/dL   Albumin 4.4 3.8 - 4.8 g/dL   Globulin, Total 1.9 1.5 - 4.5 g/dL   Albumin/Globulin Ratio 2.3 (H) 1.2 - 2.2   Bilirubin Total 0.9 0.0 - 1.2 mg/dL   Alkaline Phosphatase 60 44 - 121 IU/L   AST 23 0 - 40 IU/L   ALT 29 0 - 32 IU/L      Assessment & Plan:   Problem List Items Addressed This Visit   None   Visit Diagnoses    Forearm laceration, left, initial encounter    -  Primary   History of bruising easily          Clinically healing laceration on forearm. Superficial. No stitches. Using topical saline cleanse / aquaphor, cannot use neosporin due to allergic reaction No sign of secondary infection Reassurance continue current course  Easy bruising/bleeding Secondary to thin skin and age. Not on Aspirin or blood thinner or NSAIDs. Prior labs CBC normal including platelets, last PT INR normal range 2021. Reassurance Follow-up as needed.  No orders of the defined types were placed in this encounter.     Follow up plan: Return if symptoms worsen or fail to improve.   Nobie Putnam, [facility] 03/03/2021, 11:35 AM

## 2021-03-03 NOTE — Patient Instructions (Addendum)
Thank you for coming to the office today.  Protime-INR Order: [id number]  Status: Final result   Visible to patient: Yes (seen)   Next appt: 03/04/2021 at 01:00 PM in Radiology ([facility])   Dx: Bruising   1 Result Note   1 Patient Communication   Ref Range & Units 9 mo ago  INR 0.9 - 1.2 0.9   Comment: Reference interval is for non-anticoagulated patients.  Suggested INR therapeutic range for Vitamin K  antagonist therapy:   Standard Dose (moderate intensity           therapeutic range):    2.0 - 3.0   Higher intensity therapeutic range    2.5 - 3.5   Prothrombin Time 9.1 - 12.0 sec 9.8   Resulting Agency  [facility]        Narrative Performed by: Maryan Puls Performed at: [address]  [address] [place], [place] [id number]  Lab Director: Rush Farmer MD, Phone: [phone]    Specimen Collected: 05/27/20 14:33 Last Resulted: 05/28/20 05:36          Keep on Multivitamin - should be plenty of Vitamin K.  Keep on current treatment for the laceration on your forearm.  Bruising is normal for thin skin and does not appear to be caused by other anomaly, labs look good from previous.   Please schedule a Follow-up Appointment to: Return if symptoms worsen or fail to improve.  If you have any other questions or concerns, please feel free to call the office or send a message through [place]. You may also schedule an earlier appointment if necessary.  Additionally, you may be receiving a survey about your experience at our office within a few days to 1 week by e-mail or mail. We value your feedback.  Nobie Putnam, DO [place]

## 2021-03-04 ENCOUNTER — Ambulatory Visit
Admission: RE | Admit: 2021-03-04 | Discharge: 2021-03-04 | Disposition: A | Discharge status: Home / Self Care | Payer: Medicare HMO | Source: Ambulatory Visit | Attending: Family Medicine | Admitting: Family Medicine

## 2021-03-04 DIAGNOSIS — Z1231 Encounter for screening mammogram for malignant neoplasm of breast: Secondary | ICD-10-CM | POA: Diagnosis not present

## 2021-03-10 ENCOUNTER — Encounter: Payer: Self-pay | Admitting: Cardiology

## 2021-03-10 ENCOUNTER — Telehealth: Payer: Self-pay | Admitting: Cardiology

## 2021-03-10 ENCOUNTER — Ambulatory Visit: Payer: Medicare HMO | Admitting: Cardiology

## 2021-03-10 ENCOUNTER — Other Ambulatory Visit: Payer: Self-pay

## 2021-03-10 ENCOUNTER — Ambulatory Visit: Payer: Medicare HMO | Admitting: Dermatology

## 2021-03-10 VITALS — BP 142/70 | HR 67 | Ht 63.0 in | Wt 140.0 lb

## 2021-03-10 DIAGNOSIS — E7800 Pure hypercholesterolemia, unspecified: Secondary | ICD-10-CM | POA: Diagnosis not present

## 2021-03-10 DIAGNOSIS — I10 Essential (primary) hypertension: Secondary | ICD-10-CM | POA: Diagnosis not present

## 2021-03-10 DIAGNOSIS — N183 Chronic kidney disease, stage 3 unspecified: Secondary | ICD-10-CM

## 2021-03-10 DIAGNOSIS — D692 Other nonthrombocytopenic purpura: Secondary | ICD-10-CM | POA: Diagnosis not present

## 2021-03-10 DIAGNOSIS — I129 Hypertensive chronic kidney disease with stage 1 through stage 4 chronic kidney disease, or unspecified chronic kidney disease: Secondary | ICD-10-CM

## 2021-03-10 DIAGNOSIS — L578 Other skin changes due to chronic exposure to nonionizing radiation: Secondary | ICD-10-CM | POA: Diagnosis not present

## 2021-03-10 DIAGNOSIS — I739 Peripheral vascular disease, unspecified: Secondary | ICD-10-CM | POA: Diagnosis not present

## 2021-03-10 DIAGNOSIS — R238 Other skin changes: Secondary | ICD-10-CM | POA: Diagnosis not present

## 2021-03-10 DIAGNOSIS — R233 Spontaneous ecchymoses: Secondary | ICD-10-CM

## 2021-03-10 MED ORDER — LOSARTAN POTASSIUM 100 MG PO TABS: 100.0000 mg | ORAL_TABLET | Freq: Every day | ORAL | 5 refills | Status: AC

## 2021-03-10 MED ORDER — VERAPAMIL HCL ER 360 MG PO CP24: 360.0000 mg | ORAL_CAPSULE | Freq: Every day | ORAL | 5 refills | Status: DC

## 2021-03-10 MED ORDER — TRIAMTERENE-HCTZ 75-50 MG PO TABS
ORAL_TABLET | ORAL | 5 refills | Status: AC
Start: 2021-03-10 — End: ?

## 2021-03-10 MED ORDER — VERAPAMIL HCL ER 360 MG PO CP24: 360.0000 mg | ORAL_CAPSULE | Freq: Every day | ORAL | 5 refills | Status: AC

## 2021-03-10 MED ORDER — ATORVASTATIN CALCIUM 40 MG PO TABS: 1.0000 | ORAL_TABLET | Freq: Every day | ORAL | 5 refills | Status: AC

## 2021-03-10 NOTE — Telephone Encounter (Signed)
Patient calling Needs to make changes in regards to medications from appointment this morning Transferred to [facility]

## 2021-03-10 NOTE — Progress Notes (Signed)
   Follow-Up Visit   Subjective  Robin Lang is a 70 y.o. female who presents for the following: Follow-up (Patient here today with concerns with bruising on arms and legs. She states she is not taking any aspirin or blood thinners. Patient states she works in her yard a lot and gets a lot of bruising there. She states bruising has been getting worse for over a year now. She states her pcp has done lab test and reviewed medications, discussed treatments she is getting for back. ).  The following portions of the chart were reviewed this encounter and updated as appropriate:  Tobacco  Allergies  Meds  Problems  Med Hx  Surg Hx  Fam Hx      Objective  Well appearing patient in no apparent distress; mood and affect are within normal limits.  A focused examination was performed including bilateral arms and bilateral legs. Relevant physical exam findings are noted in the Assessment and Plan.  Objective  bilateral arms and bilateral legs: Purpura with actinic changes   Images            Assessment & Plan  Other nonthrombocytopenic purpura with Actinic Changes = Actinic Purpura = Bateman's Purpura = Senile Purpura bilateral arms and bilateral legs Purpura - Chronic; persistent and recurrent.  Treatable, but not curable. Related to trauma, age, sun damage and/or use of blood thinners, chronic use of topical and/or oral steroids  Reviewed patient's previous labs from 4/22 (Labs for kidney, liver, total protein) were all normal. Reviewed CBC ( Platelets were normal) other labs normal  Benign Observe Avoid trauma.  Wear protective sleeves and long pants.  Can use OTC arnica containing moisturizer such as Dermend Bruise Formula if desired  Reassured.   Actinic Damage - chronic, secondary to cumulative UV radiation exposure/sun exposure over time - diffuse scaly erythematous macules with underlying dyspigmentation - Recommend daily broad spectrum sunscreen SPF 30+ to  sun-exposed areas, reapply every 2 hours as needed.  - Recommend staying in the shade or wearing long sleeves, sun glasses (UVA+UVB protection) and wide brim hats (4-inch brim around the entire circumference of the hat). - Call for new or changing lesions.  Return if symptoms worsen or fail to improve.  IRuthell Rummage, CMA, am acting as scribe for Sarina Ser, MD.  Documentation: I have reviewed the above documentation for accuracy and completeness, and I agree with the above.  Sarina Ser, MD

## 2021-03-10 NOTE — Patient Instructions (Addendum)

## 2021-03-10 NOTE — Progress Notes (Signed)
Cardiology Office Note:    Date:  03/10/2021   ID:  Robin Lang, DOB April 19, 1951, MRN [id number]  PCP:  Olin Hauser, DO  [place] Cardiologist:  Kate Sable, MD  [place] Electrophysiologist:  None   Referring MD: Nobie Putnam *   Chief Complaint  Patient presents with  . Other    6 month follow up. Meds reviewed verbally with patient.     History of Present Illness:    Robin Lang is a 70 y.o. female with a hx of hypertension, hyperlipidemia, former smoker x15 years, DDD of lower back causing sciatica, PAD who presents for follow-up.    She was previously seen due to hyperlipidemia and PAD.  takes Lipitor 40 mg with improvement in cholesterol levels compared to prior.  She feels well otherwise, states having easy bruisability on her skin which was made worse with taking aspirin.  She stopped taking aspirin as such. repeat lipid panel was obtained about 2 weeks ago.  She does not check her blood pressure frequently at home.  Has been on verapamil for years, losartan, Maxzide.  Prior notes Patient also noted to have atherosclerotic disease of the peripheral arteries on MRI to evaluate sciatica.  She recently slept poorly and had right shoulder pain.  Past Medical History:  Diagnosis Date  . Baker's cyst of knee, right 2015  . Diverticulosis   . Hyperlipidemia   . Hypertension   . Osteopenia     Past Surgical History:  Procedure Laterality Date  . CATARACT EXTRACTION Right 2017  . MENISCUS REPAIR Left 2012    Current Medications: Current Meds  Medication Sig  . acetaminophen (TYLENOL) 650 MG CR tablet Take by mouth.  . cholecalciferol (VITAMIN D3) 25 MCG (1000 UNIT) tablet Take 1,000 Units by mouth daily.  . cyclobenzaprine (FLEXERIL) 10 MG tablet Take 0.5-1 tablets (5-10 mg total) by mouth 3 (three) times daily as needed for muscle spasms.  . diclofenac Sodium (VOLTAREN) 1 % GEL Apply topically 3 (three) times daily as needed.   Marland Kitchen omeprazole (PRILOSEC) 40 MG capsule Take 1 capsule (40 mg total) by mouth daily before breakfast.  . ondansetron (ZOFRAN ODT) 4 MG disintegrating tablet Take 1 tablet (4 mg total) by mouth every 8 (eight) hours as needed for nausea or vomiting.  Marland Kitchen Specialty Vitamins Products (MG-PLUS PROTEIN PO) Take by mouth.  . traMADol (ULTRAM) 50 MG tablet 1 po bid prn  . [DISCONTINUED] atorvastatin (LIPITOR) 40 MG tablet TAKE 1 TABLET BY MOUTH EVERY DAY  . [DISCONTINUED] losartan (COZAAR) 100 MG tablet Take 1 tablet (100 mg total) by mouth daily.  . [DISCONTINUED] triamterene-hydrochlorothiazide (MAXZIDE) 75-50 MG tablet TAKE 1/2 TABLET BY MOUTH ONCE DAILY  . [DISCONTINUED] verapamil (CALAN) 120 MG tablet Take 1 tablet (120 mg total) by mouth 2 (two) times daily.  . [DISCONTINUED] verapamil (VERELAN PM) 360 MG 24 hr capsule Take 1 capsule (360 mg total) by mouth at bedtime.     Allergies:   Bacitracin, Latex, and Neosporin [neomycin-bacitracin zn-polymyx]   Social History   Socioeconomic History  . Marital status: Divorced    Spouse name: Not on file  . Number of children: 2  . Years of education: Not on file  . Highest education level: Some college, no degree  Occupational History  . Not on file  Tobacco Use  . Smoking status: Former Smoker    Packs/day: 0.50    Years: 9.00    Pack years: 4.50    Quit date: 1989  Years since quitting: 33.3  . Smokeless tobacco: Never Used  Vaping Use  . Vaping Use: Never used  Substance and Sexual Activity  . Alcohol use: Yes    Alcohol/week: 2.0 standard drinks    Types: 2 Glasses of wine per week    Comment: occ  . Drug use: Never  . Sexual activity: Not Currently  Other Topics Concern  . Not on file  Social History Narrative  . Not on file   Social Determinants of Health   Financial Resource Strain: Not on file  Food Insecurity: Not on file  Transportation Needs: Not on file  Physical Activity: Not on file  Stress: Not on file   Social Connections: Not on file     Family History: The patient's family history includes Heart attack in her father; Heart disease in her father and sister; Lung cancer in her sister; Multiple sclerosis in her brother; Renal cancer in her mother; Stroke in her mother; Thyroid cancer in her brother. There is no history of Breast cancer.  ROS:   Please see the history of present illness.     All other systems reviewed and are negative.  EKGs/Labs/Other Studies Reviewed:    The following studies were reviewed today:   EKG:  EKG not ordered today.    Recent Labs: 08/23/2020: TSH 1.810 02/27/2021: ALT 29; BUN 22; Creatinine, Ser 0.97; Hemoglobin 14.2; Platelets 260; Potassium 3.9; Sodium 139  Recent Lipid Panel    Component Value Date/Time   CHOL 175 02/27/2021 0735   TRIG 49 02/27/2021 0735   HDL 92 02/27/2021 0735   CHOLHDL 1.9 02/27/2021 0735   LDLCALC 73 02/27/2021 0735    Physical Exam:    VS:  BP (!) 142/70 (BP Location: Left Arm, Patient Position: Sitting, Cuff Size: Normal)   Pulse 67   Ht 5\' 3"  (1.6 m)   Wt 140 lb (63.5 kg)   SpO2 96%   BMI 24.80 kg/m     Wt Readings from Last 3 Encounters:  03/10/21 140 lb (63.5 kg)  03/03/21 142 lb 6.4 oz (64.6 kg)  02/21/21 143 lb 3.2 oz (65 kg)      GEN:  Well nourished, well developed in no acute distress HEENT: Normal NECK: No JVD; No carotid bruits LYMPHATICS: No lymphadenopathy CARDIAC: RRR, systolic murmur RESPIRATORY:  Clear to auscultation without rales, wheezing or rhonchi  ABDOMEN: Soft, non-tender, non-distended MUSCULOSKELETAL:  No edema; bruises noted on arms bilaterally SKIN: Warm and dry NEUROLOGIC:  Alert and oriented x 3 PSYCHIATRIC:  Normal affect   ASSESSMENT:    1. PAD (peripheral artery disease) ([place])   2. Essential hypertension   3. Pure hypercholesterolemia   4. Easy bruisability   5. Benign hypertension with chronic kidney disease, stage III (HCC)    PLAN:    In order of problems  listed above:  1. Atherosclerotic cardiovascular disease noted on peripheral arteries on MRI. g daily,  Lipitor 40 mg daily. 2. Hypertension, BP elevated.  Continue Maxide and losartan.  Increase verapamil to 360 mg daily. 3. Hyperlipidemia, cholesterol controlled, LDL 73.  Continue Lipitor 40 mg daily. 4. Patient with easy bruisability, recommend she follows up with PCP or dermatology for adequate management  Follow-up in 6 months.  Total encounter time more than 30 minutes  Greater than 50% was spent in counseling and coordination of care with the patient   This note was generated in part or whole with voice recognition software. Voice recognition is usually quite accurate but  there are transcription errors that can and very often do occur. I apologize for any typographical errors that were not detected and corrected.  Medication Adjustments/Labs and Tests Ordered: Current medicines are reviewed at length with the patient today.  Concerns regarding medicines are outlined above.  Orders Placed This Encounter  Procedures  . EKG 12-Lead   Meds ordered this encounter  Medications  . DISCONTD: verapamil (VERELAN PM) 360 MG 24 hr capsule    Sig: Take 1 capsule (360 mg total) by mouth at bedtime.    Dispense:  30 capsule    Refill:  5  . atorvastatin (LIPITOR) 40 MG tablet    Sig: Take 1 tablet (40 mg total) by mouth daily.    Dispense:  30 tablet    Refill:  5  . losartan (COZAAR) 100 MG tablet    Sig: Take 1 tablet (100 mg total) by mouth daily.    Dispense:  30 tablet    Refill:  5    Add refills  . triamterene-hydrochlorothiazide (MAXZIDE) 75-50 MG tablet    Sig: TAKE 1/2 TABLET BY MOUTH ONCE DAILY    Dispense:  30 tablet    Refill:  5  . verapamil (VERELAN PM) 360 MG 24 hr capsule    Sig: Take 1 capsule (360 mg total) by mouth at bedtime.    Dispense:  30 capsule    Refill:  5    Patient Instructions  Medication Instructions:   Your physician has recommended you make  the following change in your medication:   1. START taking Verapamil (Verelan PM) 360 MG once daily at bedtime.   *If you need a refill on your cardiac medications before your next appointment, please call your pharmacy*   Lab Work: None ordered If you have labs (blood work) drawn today and your tests are completely normal, you will receive your results only by: Marland Kitchen MyChart Message (if you have MyChart) OR . A paper copy in the mail If you have any lab test that is abnormal or we need to change your treatment, we will call you to review the results.   Testing/Procedures: None ordered   Follow-Up: At [facility], you and your health needs are our priority.  As part of our continuing mission to provide you with exceptional heart care, we have created designated Provider Care Teams.  These Care Teams include your primary Cardiologist (physician) and Advanced Practice Providers (APPs -  Physician Assistants and Nurse Practitioners) who all work together to provide you with the care you need, when you need it.  We recommend signing up for the patient portal called "MyChart".  Sign up information is provided on this After Visit Summary.  MyChart is used to connect with patients for Virtual Visits (Telemedicine).  Patients are able to view lab/test results, encounter notes, upcoming appointments, etc.  Non-urgent messages can be sent to your provider as well.   To learn more about what you can do with MyChart, go to [email or web address].    Your next appointment:   6 month(s)  The format for your next appointment:   In Person  Provider:   Kate Sable, MD   Other Instructions      Signed, Kate Sable, MD  03/10/2021 12:26 PM    [place]

## 2021-03-10 NOTE — Patient Instructions (Signed)
Medication Instructions:   Your physician has recommended you make the following change in your medication:   1. START taking Verapamil (Verelan PM) 360 MG once daily at bedtime.   *If you need a refill on your cardiac medications before your next appointment, please call your pharmacy*   Lab Work: None ordered If you have labs (blood work) drawn today and your tests are completely normal, you will receive your results only by: Marland Kitchen MyChart Message (if you have MyChart) OR . A paper copy in the mail If you have any lab test that is abnormal or we need to change your treatment, we will call you to review the results.   Testing/Procedures: None ordered   Follow-Up: At [facility], you and your health needs are our priority.  As part of our continuing mission to provide you with exceptional heart care, we have created designated Provider Care Teams.  These Care Teams include your primary Cardiologist (physician) and Advanced Practice Providers (APPs -  Physician Assistants and Nurse Practitioners) who all work together to provide you with the care you need, when you need it.  We recommend signing up for the patient portal called "MyChart".  Sign up information is provided on this After Visit Summary.  MyChart is used to connect with patients for Virtual Visits (Telemedicine).  Patients are able to view lab/test results, encounter notes, upcoming appointments, etc.  Non-urgent messages can be sent to your provider as well.   To learn more about what you can do with MyChart, go to [email or web address].    Your next appointment:   6 month(s)  The format for your next appointment:   In Person  Provider:   Kate Sable, MD   Other Instructions

## 2021-03-11 ENCOUNTER — Encounter: Payer: Self-pay | Admitting: Dermatology

## 2021-03-20 DIAGNOSIS — M48062 Spinal stenosis, lumbar region with neurogenic claudication: Secondary | ICD-10-CM | POA: Diagnosis not present

## 2021-03-20 DIAGNOSIS — M5442 Lumbago with sciatica, left side: Secondary | ICD-10-CM | POA: Diagnosis not present

## 2021-03-20 DIAGNOSIS — G8929 Other chronic pain: Secondary | ICD-10-CM | POA: Diagnosis not present

## 2021-04-07 DIAGNOSIS — M7121 Synovial cyst of popliteal space [Baker], right knee: Secondary | ICD-10-CM | POA: Diagnosis not present

## 2021-04-07 DIAGNOSIS — I739 Peripheral vascular disease, unspecified: Secondary | ICD-10-CM | POA: Diagnosis not present

## 2021-04-07 DIAGNOSIS — G8929 Other chronic pain: Secondary | ICD-10-CM | POA: Diagnosis not present

## 2021-04-07 DIAGNOSIS — M1732 Unilateral post-traumatic osteoarthritis, left knee: Secondary | ICD-10-CM | POA: Diagnosis not present

## 2021-04-07 DIAGNOSIS — M25462 Effusion, left knee: Secondary | ICD-10-CM | POA: Diagnosis not present

## 2021-04-07 DIAGNOSIS — M25561 Pain in right knee: Secondary | ICD-10-CM | POA: Diagnosis not present

## 2021-04-07 DIAGNOSIS — M1731 Unilateral post-traumatic osteoarthritis, right knee: Secondary | ICD-10-CM | POA: Diagnosis not present

## 2021-04-07 DIAGNOSIS — M25562 Pain in left knee: Secondary | ICD-10-CM | POA: Diagnosis not present

## 2021-04-08 DIAGNOSIS — M48062 Spinal stenosis, lumbar region with neurogenic claudication: Secondary | ICD-10-CM | POA: Diagnosis not present

## 2021-04-08 DIAGNOSIS — M5442 Lumbago with sciatica, left side: Secondary | ICD-10-CM | POA: Diagnosis not present

## 2021-04-08 DIAGNOSIS — M7062 Trochanteric bursitis, left hip: Secondary | ICD-10-CM | POA: Diagnosis not present

## 2021-04-08 DIAGNOSIS — G8929 Other chronic pain: Secondary | ICD-10-CM | POA: Diagnosis not present

## 2021-04-25 ENCOUNTER — Other Ambulatory Visit: Payer: Self-pay | Admitting: Family Medicine

## 2021-04-25 DIAGNOSIS — K219 Gastro-esophageal reflux disease without esophagitis: Secondary | ICD-10-CM

## 2021-05-30 ENCOUNTER — Encounter: Payer: Self-pay | Admitting: Dermatology

## 2021-06-24 ENCOUNTER — Telehealth: Payer: Self-pay | Admitting: Family Medicine

## 2021-06-24 NOTE — Telephone Encounter (Signed)
Left message for patient to call back and schedule Medicare Annual Wellness Visit (AWV) to be done virtually or by telephone.  No hx of AWV eligible as of 04/16/16  Please schedule at anytime with [facility].      40 Minutes appointment   Any questions, please call me at [phone]

## 2021-06-30 ENCOUNTER — Encounter: Payer: Medicare HMO | Admitting: Dermatology

## 2022-01-13 ENCOUNTER — Telehealth: Payer: Self-pay

## 2022-01-13 NOTE — Telephone Encounter (Signed)
Left message for patient to call back and schedule the Medicare Annual Wellness Visit (AWV) virtually, telephone or face to face.   Last AWV  04/16/16   [phone]   Donnie Mesa, CMA

## 2022-01-21 ENCOUNTER — Telehealth: Payer: Self-pay

## 2022-01-21 NOTE — Telephone Encounter (Signed)
Attempted to contact the patient to schedule Medicare Wellness Visit. The patient relocated out of [place]. She moved to [place] several months ago.  ?
